# Patient Record
Sex: Male | Born: 1960
Health system: Southern US, Community
[De-identification: ages and names within clinical notes are randomized; demographics above are authoritative.]

## PROBLEM LIST (undated history)

## (undated) DIAGNOSIS — G4489 Other headache syndrome: Secondary | ICD-10-CM

## (undated) DIAGNOSIS — F17200 Nicotine dependence, unspecified, uncomplicated: Secondary | ICD-10-CM

## (undated) DIAGNOSIS — R002 Palpitations: Secondary | ICD-10-CM

## (undated) DIAGNOSIS — D509 Iron deficiency anemia, unspecified: Secondary | ICD-10-CM

## (undated) DIAGNOSIS — I10 Essential (primary) hypertension: Secondary | ICD-10-CM

## (undated) DIAGNOSIS — E785 Hyperlipidemia, unspecified: Secondary | ICD-10-CM

## (undated) DIAGNOSIS — R000 Tachycardia, unspecified: Secondary | ICD-10-CM

## (undated) HISTORY — DX: Nicotine dependence, unspecified, uncomplicated: F17.200

## (undated) HISTORY — DX: Palpitations: R00.2

## (undated) HISTORY — DX: Hyperlipidemia, unspecified: E78.5

## (undated) HISTORY — DX: Other headache syndrome: G44.89

## (undated) HISTORY — DX: Iron deficiency anemia, unspecified: D50.9

## (undated) HISTORY — DX: Tachycardia, unspecified: R00.0

---

## 2008-11-03 ENCOUNTER — Encounter: Admission: RE | Admit: 2008-11-03 | Discharge: 2008-11-03 | Payer: Self-pay | Admitting: Infectious Diseases

## 2011-05-08 ENCOUNTER — Emergency Department (INDEPENDENT_AMBULATORY_CARE_PROVIDER_SITE_OTHER): Payer: Self-pay

## 2011-05-08 ENCOUNTER — Emergency Department (INDEPENDENT_AMBULATORY_CARE_PROVIDER_SITE_OTHER)
Admission: EM | Admit: 2011-05-08 | Discharge: 2011-05-08 | Disposition: A | Discharge status: Home / Self Care | Payer: Self-pay | Source: Home / Self Care | Attending: Family Medicine | Admitting: Family Medicine

## 2011-05-08 DIAGNOSIS — IMO0002 Reserved for concepts with insufficient information to code with codable children: Secondary | ICD-10-CM

## 2011-05-08 DIAGNOSIS — S8392XA Sprain of unspecified site of left knee, initial encounter: Secondary | ICD-10-CM

## 2011-05-08 HISTORY — DX: Essential (primary) hypertension: I10

## 2011-05-08 MED ORDER — TRAMADOL HCL 50 MG PO TABS: 50.0000 mg | ORAL_TABLET | Freq: Four times a day (QID) | ORAL | Status: AC | PRN

## 2011-05-08 MED ORDER — IBUPROFEN 600 MG PO TABS: 600.0000 mg | ORAL_TABLET | Freq: Three times a day (TID) | ORAL | Status: AC | PRN

## 2011-05-08 NOTE — ED Notes (Signed)
Pt states he slipped down some stairs yesterday and landed on lt knee.  C/o pain in lt knee.  Has not taken anything for pain

## 2011-05-12 NOTE — ED Provider Notes (Signed)
History     CSN: [id number]  Arrival date & time 05/08/11  1542   First MD Initiated Contact with Patient 05/08/11 1605      Chief Complaint  Patient presents with  . Knee Pain    (Consider location/radiation/quality/duration/timing/severity/associated sxs/prior treatment) HPI Comments: 51 y/o male here c/o left knee pain after falling down stairs at work landing on his flexed left knee. This happened yesterday. Denies hitting his head or any other body parts. No swelling, abrasions, lacerations or hematomas.  Reports pain with walking. Not taking any pain medication.   Past Medical History  Diagnosis Date  . Hypertension     History reviewed. No pertinent past surgical history.  No family history on file.  History  Substance Use Topics  . Smoking status: Never Smoker   . Smokeless tobacco: Not on file  . Alcohol Use: No      Review of Systems  Constitutional: Negative.   Musculoskeletal: Negative.        Left knee pain as above.  All other systems reviewed and are negative.    Allergies  Review of patient's allergies indicates no known allergies.  Home Medications   Current Outpatient Rx  Name Route Sig Dispense Refill  . IBUPROFEN 600 MG PO TABS Oral Take 1 tablet (600 mg total) by mouth every 8 (eight) hours as needed for pain. 30 tablet 0  . TRAMADOL HCL 50 MG PO TABS Oral Take 1 tablet (50 mg total) by mouth every 6 (six) hours as needed for pain. 15 tablet 0    BP 145/97  Pulse 93  Temp(Src) 99.7 F (37.6 C) (Oral)  Resp 16  SpO2 100%  Physical Exam  Nursing note and vitals reviewed. Constitutional: He is oriented to person, place, and time. He appears well-developed and well-nourished. No distress.  HENT:  Head: Normocephalic and atraumatic.  Right Ear: External ear normal.  Left Ear: External ear normal.  Mouth/Throat: Oropharynx is clear and moist.  Eyes: EOM are normal. Pupils are equal, round, and reactive to light.  Neck: Normal range  of motion.  Cardiovascular: Normal heart sounds.   Pulmonary/Chest: Breath sounds normal.  Abdominal: Soft. There is no tenderness.  Musculoskeletal:       Left knee: Patient is wait bearing. No obvious deformity swelling, erythema, hematoma or skin cuts. Diffuse tenderness to palpation more focalized superior to the patella. Patella appears intact and well aligned, no ballotable. Fair active and passive flexion and extension of the knee with pain reported with active flexion.  No laxity, no able to palpate effusion. Negative meniscal maneuvers.   Neurological: He is alert and oriented to person, place, and time.    ED Course  Procedures (including critical care time)  Labs Reviewed - No data to display No results found.   1. Left knee sprain       MDM  No fracture, suprapatellar swelling on Xrays. Normal active flexion but likely over stretch of patellofemoral tendon. Pt placed in knee immobilizer and crutches. RICE, NSAIDs and rehab exercises prescription provided with ortho or sports medicine follow up reccommended.        Sharin Grave, MD 05/12/11 [phone]

## 2017-01-18 ENCOUNTER — Emergency Department (HOSPITAL_COMMUNITY)
Admission: EM | Admit: 2017-01-18 | Discharge: 2017-01-18 | Disposition: A | Discharge status: Home / Self Care | Payer: Self-pay | Attending: Emergency Medicine | Admitting: Emergency Medicine

## 2017-01-18 ENCOUNTER — Emergency Department (HOSPITAL_COMMUNITY): Payer: Self-pay

## 2017-01-18 ENCOUNTER — Encounter (HOSPITAL_COMMUNITY): Payer: Self-pay | Admitting: Emergency Medicine

## 2017-01-18 DIAGNOSIS — R1030 Lower abdominal pain, unspecified: Secondary | ICD-10-CM | POA: Insufficient documentation

## 2017-01-18 DIAGNOSIS — F17200 Nicotine dependence, unspecified, uncomplicated: Secondary | ICD-10-CM | POA: Insufficient documentation

## 2017-01-18 DIAGNOSIS — M545 Low back pain, unspecified: Secondary | ICD-10-CM

## 2017-01-18 DIAGNOSIS — I10 Essential (primary) hypertension: Secondary | ICD-10-CM | POA: Insufficient documentation

## 2017-01-18 LAB — URINALYSIS, ROUTINE W REFLEX MICROSCOPIC
Bacteria, UA: NONE SEEN
Bilirubin Urine: NEGATIVE
Glucose, UA: NEGATIVE mg/dL
Hgb urine dipstick: NEGATIVE
Ketones, ur: NEGATIVE mg/dL
LEUKOCYTES UA: NEGATIVE
Nitrite: NEGATIVE
PROTEIN: 100 mg/dL — AB
SPECIFIC GRAVITY, URINE: 1.035 — AB (ref 1.005–1.030)
SQUAMOUS EPITHELIAL / LPF: NONE SEEN
pH: 5 (ref 5.0–8.0)

## 2017-01-18 MED ORDER — KETOROLAC TROMETHAMINE 60 MG/2ML IM SOLN
60.0000 mg | Freq: Once | INTRAMUSCULAR | Status: AC
  Administered 2017-01-18: 60 mg via INTRAMUSCULAR
  Filled 2017-01-18: qty 2

## 2017-01-18 MED ORDER — CYCLOBENZAPRINE HCL 10 MG PO TABS: 10.0000 mg | ORAL_TABLET | Freq: Two times a day (BID) | ORAL | 0 refills | Status: DC | PRN

## 2017-01-18 MED ORDER — IBUPROFEN 600 MG PO TABS: 600.0000 mg | ORAL_TABLET | Freq: Four times a day (QID) | ORAL | 0 refills | Status: DC | PRN

## 2017-01-18 NOTE — ED Provider Notes (Signed)
[facility]-EMERGENCY DEPT Provider Note   CSN: [id number] Arrival date & time: 01/18/17  3086     History   Chief Complaint Chief Complaint  Patient presents with  . Back Pain    HPI Jason Kent is a 56 y.o. male who presents with back pain and abdominal pain. No significant PMH. He states he was in his usual state of health until last night at about 2AM when he was acutely woken up with low back pain and lower abdominal pain. He has never has this before. The pain is severe and he cannot get comfortable. It feels better standing and worse with he sits or lies down. Nothing has made it better. He checked his blood pressure and it was in the 200s systolic so he came to the ED. He has done some lifting but nothing heavy and did not have pain after lifting at work on Friday. He denies leg pain or weakness. No hematuria or hx of kidney stones. No fever, syncope, trauma, unexplained weight loss, hx of cancer, loss of bowel/bladder function, saddle anesthesia, urinary retention, IVDU.    HPI  Past Medical History:  Diagnosis Date  . Hypertension     There are no active problems to display for this patient.   History reviewed. No pertinent surgical history.     Home Medications    Prior to Admission medications   Medication Sig Start Date End Date Taking? Authorizing Provider  ibuprofen (ADVIL,MOTRIN) 200 MG tablet Take 400 mg by mouth every 6 (six) hours as needed for headache or moderate pain.   Yes [provider]    Family History No family history on file.  Social History Social History  Substance Use Topics  . Smoking status: Current Every Day Smoker  . Smokeless tobacco: Current User  . Alcohol use Yes     Allergies   Patient has no known allergies.   Review of Systems Review of Systems  Constitutional: Negative for fever.  Gastrointestinal: Positive for abdominal pain. Negative for nausea and vomiting.  Genitourinary: Negative for difficulty  urinating, dysuria, frequency and hematuria.  Musculoskeletal: Positive for back pain and myalgias. Negative for gait problem.  Neurological: Negative for syncope, weakness and numbness.  All other systems reviewed and are negative.    Physical Exam Updated Vital Signs BP (!) 152/109 (BP Location: Left Arm)   Pulse (!) 120   Temp 97.9 F (36.6 C) (Oral)   Resp 17   Ht  (1.753 m)   SpO2 98%   Physical Exam  Constitutional: He is oriented to person, place, and time. He appears well-developed and well-nourished. He appears distressed (pacing around room).  Tremulous  HENT:  Head: Normocephalic and atraumatic.  Eyes: Pupils are equal, round, and reactive to light. Conjunctivae are normal. Right eye exhibits no discharge. Left eye exhibits no discharge. No scleral icterus.  Neck: Normal range of motion.  Cardiovascular: Regular rhythm.  Tachycardia present.  Exam reveals no gallop and no friction rub.   No murmur heard. Pulmonary/Chest: Effort normal and breath sounds normal. No respiratory distress. He has no wheezes. He has no rales. He exhibits no tenderness.  Abdominal: Soft. Bowel sounds are normal. He exhibits no distension and no mass. There is tenderness (lower abdominal tenderess). There is no rebound and no guarding. No hernia.  Musculoskeletal:  Back: Inspection: No masses, deformity, or rash Palpation: Diffuse low back tenderness Strength: 5/5 in lower extremities and normal plantar and dorsiflexion Sensation: Intact sensation with light  touch in lower extremities bilaterally Reflexes: Patellar reflex is 2+ bilaterally Gait: Normal gait   Neurological: He is alert and oriented to person, place, and time.  Skin: Skin is warm and dry.  Psychiatric: He has a normal mood and affect. His behavior is normal.  Nursing note and vitals reviewed.    ED Treatments / Results  Labs (all labs ordered are listed, but only abnormal results are displayed) Labs Reviewed    URINALYSIS, ROUTINE W REFLEX MICROSCOPIC - Abnormal; Notable for the following:       Result Value   Specific Gravity, Urine 1.035 (*)    Protein, ur 100 (*)    All other components within normal limits    EKG  EKG Interpretation None       Radiology Ct Renal Stone Study  Result Date: 01/18/2017 CLINICAL DATA:  Mid back pain for 2 days.  History of hypertension. EXAM: CT ABDOMEN AND PELVIS WITHOUT CONTRAST TECHNIQUE: Multidetector CT imaging of the abdomen and pelvis was performed following the standard protocol without IV contrast. COMPARISON:  None. FINDINGS: Lower chest: Mild cardiomegaly. Hepatobiliary: Diffuse hepatic steatosis.  Gallbladder unremarkable. Pancreas: Unremarkable Spleen: Unremarkable Adrenals/Urinary Tract: Adrenal glands normal. Exophytic 2.1 cm lesion from the left mid kidney posteriorly, density 3 Hounsfield units (fluid density). Less likely a cyst. No urinary tract calculi or hydronephrosis. Stomach/Bowel: Unremarkable Vascular/Lymphatic: Unremarkable Reproductive: Unremarkable Other: No supplemental non-categorized findings. Musculoskeletal: Mild lumbar spondylosis and degenerative disc disease with borderline bilateral foraminal stenosis at the L4-5 level. IMPRESSION: 1. A specific cause for the patient's pain is not identified. 2. Mild cardiomegaly. 3. Diffuse hepatic steatosis. 4. Left renal cyst. 5. Mild lumbar spondylosis and degenerative disc disease. Electronically Signed   By: Gaylyn Rong M.D.   On: 01/18/2017 12:24    Procedures Procedures (including critical care time)  Medications Ordered in ED Medications  ketorolac (TORADOL) injection 60 mg (60 mg Intramuscular Given 01/18/17 1105)     Initial Impression / Assessment and Plan / ED Course  I have reviewed the triage vital signs and the nursing notes.  Pertinent labs & imaging results that were available during my care of the patient were reviewed by me and considered in my medical decision  making (see chart for details).  56 year old male presents with new onset low back pain. Neuro exam is unremarkable. He is initially tachycardic and hypertensive. Tachycardia is improved on recheck however he is still hypertensive. CT renal was done due to pt's distress. Toradol ordered.   CT shows mild cardiomegaly, hepatic steatosis, and lumbar degenerative disc disease. Discussed results with patient. Advised establish care with primary doctor. Rx for Ibuprofen and Flexeril was given. Family verbalized understanding.  Final Clinical Impressions(s) / ED Diagnoses   Final diagnoses:  Acute bilateral low back pain without sciatica    New Prescriptions New Prescriptions   No medications on file     Bethel Born, PA-C 01/18/17 1426    Bethel Born, PA-C 01/18/17 1426    Gwyneth Sprout, MD 01/18/17 2258

## 2017-01-18 NOTE — ED Notes (Signed)
Patient denies changes to bowel of bladder.

## 2017-01-18 NOTE — Discharge Instructions (Signed)
Take Ibuprofen three times daily for the next week. Take this medicine with food. Take muscle relaxer at bedtime to help you sleep. This medicine makes you drowsy so do not take before driving or work Use a heating pad for sore muscles - use for 20 minutes several times a day Return for worsening symptoms Call [facility] to establish care with a primary doctor

## 2017-01-18 NOTE — ED Triage Notes (Signed)
Son/translator stated  Back pain since last night. No injury

## 2018-01-15 ENCOUNTER — Emergency Department (HOSPITAL_COMMUNITY): Payer: Self-pay

## 2018-01-15 ENCOUNTER — Emergency Department (HOSPITAL_COMMUNITY)
Admission: EM | Admit: 2018-01-15 | Discharge: 2018-01-15 | Disposition: A | Discharge status: Home / Self Care | Payer: Self-pay | Attending: Emergency Medicine | Admitting: Emergency Medicine

## 2018-01-15 ENCOUNTER — Other Ambulatory Visit: Payer: Self-pay

## 2018-01-15 ENCOUNTER — Encounter (HOSPITAL_COMMUNITY): Payer: Self-pay | Admitting: *Deleted

## 2018-01-15 DIAGNOSIS — R42 Dizziness and giddiness: Secondary | ICD-10-CM | POA: Insufficient documentation

## 2018-01-15 DIAGNOSIS — Z79899 Other long term (current) drug therapy: Secondary | ICD-10-CM | POA: Insufficient documentation

## 2018-01-15 DIAGNOSIS — I10 Essential (primary) hypertension: Secondary | ICD-10-CM | POA: Insufficient documentation

## 2018-01-15 DIAGNOSIS — F17200 Nicotine dependence, unspecified, uncomplicated: Secondary | ICD-10-CM | POA: Insufficient documentation

## 2018-01-15 LAB — CBC
HEMATOCRIT: 35.4 % — AB (ref 39.0–52.0)
HEMOGLOBIN: 10.5 g/dL — AB (ref 13.0–17.0)
MCH: 24.9 pg — AB (ref 26.0–34.0)
MCHC: 29.7 g/dL — AB (ref 30.0–36.0)
MCV: 83.9 fL (ref 78.0–100.0)
Platelets: 258 10*3/uL (ref 150–400)
RBC: 4.22 MIL/uL (ref 4.22–5.81)
RDW: 17 % — AB (ref 11.5–15.5)
WBC: 6.2 10*3/uL (ref 4.0–10.5)

## 2018-01-15 LAB — URINALYSIS, ROUTINE W REFLEX MICROSCOPIC
BACTERIA UA: NONE SEEN
BILIRUBIN URINE: NEGATIVE
Glucose, UA: NEGATIVE mg/dL
Ketones, ur: NEGATIVE mg/dL
NITRITE: NEGATIVE
PH: 6 (ref 5.0–8.0)
Protein, ur: NEGATIVE mg/dL
SPECIFIC GRAVITY, URINE: 1.004 — AB (ref 1.005–1.030)

## 2018-01-15 LAB — BASIC METABOLIC PANEL
Anion gap: 12 (ref 5–15)
BUN: 7 mg/dL (ref 6–20)
CALCIUM: 9.6 mg/dL (ref 8.9–10.3)
CO2: 22 mmol/L (ref 22–32)
Chloride: 101 mmol/L (ref 98–111)
Creatinine, Ser: 0.79 mg/dL (ref 0.61–1.24)
GFR calc Af Amer: >60 mL/min (ref >60)
GFR calc non Af Amer: >60 mL/min (ref >60)
GLUCOSE: 134 mg/dL — AB (ref 70–99)
Potassium: 3.4 mmol/L — ABNORMAL LOW (ref 3.5–5.1)
Sodium: 135 mmol/L (ref 135–145)

## 2018-01-15 MED ORDER — KETOROLAC TROMETHAMINE 60 MG/2ML IM SOLN
15.0000 mg | Freq: Once | INTRAMUSCULAR | Status: AC
  Administered 2018-01-15: 15 mg via INTRAMUSCULAR
  Filled 2018-01-15: qty 2

## 2018-01-15 MED ORDER — MECLIZINE HCL 25 MG PO TABS: 25.0000 mg | ORAL_TABLET | Freq: Three times a day (TID) | ORAL | 0 refills | Status: DC | PRN

## 2018-01-15 MED ORDER — MECLIZINE HCL 25 MG PO TABS
25.0000 mg | ORAL_TABLET | Freq: Once | ORAL | Status: AC
  Administered 2018-01-15: 25 mg via ORAL
  Filled 2018-01-15: qty 1

## 2018-01-15 MED ORDER — HYDRALAZINE HCL 25 MG PO TABS: 12.5000 mg | ORAL_TABLET | Freq: Two times a day (BID) | ORAL | 0 refills | Status: DC

## 2018-01-15 NOTE — ED Provider Notes (Signed)
Patient placed in Quick Look pathway, seen and evaluated   Chief Complaint: headache, vertigo  HPI: Patient complains of acute onset of headache and a spinning sensation starting yesterday.  Symptoms have been waxing and waning.  It was causing him difficulty driving today.  Currently symptoms are mild.  He is able to walk.  He does not typically get headaches.  Denies head injury, fevers.  ROS:  Positive ROS: (+) Headache Negative ROS: (-) Confusion, fever, vomiting  Physical Exam:   Gen: No distress  Neuro: Awake and Alert  Skin: Warm    Focused Exam: Heart RRR, nml S1,S2, no m/r/g; Lungs CTAB; Abd soft, NT, no rebound or guarding; Ext 2+ pedal pulses bilaterally, no edema; Neuro upper and lower extremity with normal strength, grossly normal cranial nerves, patient ambulates without difficulty.  BP (!) 176/102 (BP Location: Right Arm)   Pulse (!) 106   Temp 99 F (37.2 C) (Oral)   Resp 20   SpO2 100%   Plan: Patient with reported vertigo and headache.  As patient does not typically have headaches and age is greater than 50, will perform head CT to rule out intracranial abnormality.  Low concern for stroke at this time.  Initiation of care has begun. The patient has been counseled on the process, plan, and necessity for staying for the completion/evaluation, and the remainder of the medical screening examination    Renne CriglerGeiple, Taylorann Tkach, Cordelia Poche-C 01/15/18 1430    Alvira MondaySchlossman, Erin, MD 01/16/18 [phone]

## 2018-01-15 NOTE — Discharge Instructions (Signed)
Take hydralazine for high blood pressure.  As we discussed, your blood pressure was high here in the Ed. he did have it rechecked.  Please follow-up with the [facility] to have your blood pressure reevaluated.  Take the meclizine as directed for dizziness.  Return to emergency department for any vision changes, headache, chest pain, difficulty breathing, numbness/weakness of the arms or legs, difficulty walking, vomiting or any other worsening or concerning symptoms.

## 2018-01-15 NOTE — ED Provider Notes (Signed)
[facility] EMERGENCY DEPARTMENT Provider Note   CSN: [id number] Arrival date & time: 01/15/18  1411     History   Chief Complaint Chief Complaint  Patient presents with  . Headache  . Dizziness    HPI Jason Kent is a 57 y.o. male past medical history of hypertension (who is not on any medications) presents for evaluation of 3 days of frontal headache and dizziness.  He states that symptoms are intermittent and brought on by any specific action.  He states that there is no alleviating or aggravating factors of the headache.  He states that when he has the symptoms, the last for about an hour before resolving.  He does report that yesterday while he was at work, he had to stop working and sit on the floor because he states he felt like he was dizzy.  He states that when the dizziness occurs, he has no difficulty walking but instead feels like a spinning sensation.  States that the headache is frontal in nature and states it is gradual when it starts. No history of trauma, fall. Reports that his headache improves when he eats something bitter.  He has not taken any medications for the pain.  He states that when he has this headache and dizziness, he does not have any vision changes, numbness/weakness, speech difficulty.  He has not had any nausea or vomiting.  He reports he is still been able to ambulate without any difficulty.  He states he is not currently on any medications for her blood his blood pressure.  He states he has been told he has high blood pressure before but states that he has never gotten evaluated and been put on medication.  He denies any blurry vision, chest pain, difficulty breathing, abdominal pain, numbness/weakness of his arms or legs.  The history is provided by the patient. The history is limited by a language barrier. A language interpreter was used.    Past Medical History:  Diagnosis Date  . Hypertension     There are no active  problems to display for this patient.   History reviewed. No pertinent surgical history.      Home Medications    Prior to Admission medications   Medication Sig Start Date End Date Taking? Authorizing Provider  cyclobenzaprine (FLEXERIL) 10 MG tablet Take 1 tablet (10 mg total) by mouth 2 (two) times daily as needed for muscle spasms. 01/18/17   Bethel Born, PA-C  hydrALAZINE (APRESOLINE) 25 MG tablet Take 0.5 tablets (12.5 mg total) by mouth 2 (two) times daily. 01/15/18 02/14/18  Maxwell Caul, PA-C  ibuprofen (ADVIL,MOTRIN) 200 MG tablet Take 400 mg by mouth every 6 (six) hours as needed for headache or moderate pain.    [provider]  ibuprofen (ADVIL,MOTRIN) 600 MG tablet Take 1 tablet (600 mg total) by mouth every 6 (six) hours as needed. 01/18/17   Bethel Born, PA-C  meclizine (ANTIVERT) 25 MG tablet Take 1 tablet (25 mg total) by mouth 3 (three) times daily as needed for dizziness. 01/15/18   Maxwell Caul, PA-C    Family History History reviewed. No pertinent family history.  Social History Social History   Tobacco Use  . Smoking status: Current Every Day Smoker  . Smokeless tobacco: Current User  Substance Use Topics  . Alcohol use: Yes  . Drug use: No     Allergies   Patient has no known allergies.   Review of Systems Review  of Systems  Constitutional: Negative for fever.  Eyes: Negative for visual disturbance.  Respiratory: Negative for cough and shortness of breath.   Cardiovascular: Negative for chest pain.  Gastrointestinal: Negative for abdominal pain, nausea and vomiting.  Genitourinary: Negative for dysuria and hematuria.  Neurological: Positive for dizziness and headaches. Negative for weakness and numbness.  All other systems reviewed and are negative.    Physical Exam Updated Vital Signs BP (!) 158/97 (BP Location: Right Arm)   Pulse (!) 58   Temp 98.3 F (36.8 C) (Oral)   Resp 18   SpO2 100%   Physical  Exam  Constitutional: He is oriented to person, place, and time. He appears well-developed and well-nourished.  Well appearing  HENT:  Head: Normocephalic and atraumatic.  Mouth/Throat: Oropharynx is clear and moist and mucous membranes are normal.  No tenderness to palpation of skull. No deformities or crepitus noted. No open wounds, abrasions or lacerations.   Eyes: Pupils are equal, round, and reactive to light. Conjunctivae, EOM and lids are normal.  Neck: Full passive range of motion without pain.  Cardiovascular: Normal rate, regular rhythm, normal heart sounds and normal pulses. Exam reveals no gallop and no friction rub.  No murmur heard. Pulmonary/Chest: Effort normal and breath sounds normal.  Lungs clear to auscultation bilaterally.  Symmetric chest rise.  No wheezing, rales, rhonchi.  Abdominal: Soft. Normal appearance. There is no tenderness. There is no rigidity and no guarding.  Musculoskeletal: Normal range of motion.  Neurological: He is alert and oriented to person, place, and time.  Cranial nerves III-XII intact Follows commands, Moves all extremities  5/5 strength to BUE and BLE  Sensation intact throughout all major nerve distributions Normal coordination No pronator drift. No gait abnormalities  No slurred speech. No facial droop.   Skin: Skin is warm and dry. Capillary refill takes less than 2 seconds.  Psychiatric: He has a normal mood and affect. His speech is normal.  Nursing note and vitals reviewed.    ED Treatments / Results  Labs (all labs ordered are listed, but only abnormal results are displayed) Labs Reviewed  BASIC METABOLIC PANEL - Abnormal; Notable for the following components:      Result Value   Potassium 3.4 (*)    Glucose, Bld 134 (*)    All other components within normal limits  CBC - Abnormal; Notable for the following components:   Hemoglobin 10.5 (*)    HCT 35.4 (*)    MCH 24.9 (*)    MCHC 29.7 (*)    RDW 17.0 (*)    All other  components within normal limits  URINALYSIS, ROUTINE W REFLEX MICROSCOPIC - Abnormal; Notable for the following components:   Color, Urine STRAW (*)    Specific Gravity, Urine 1.004 (*)    Hgb urine dipstick SMALL (*)    Leukocytes, UA TRACE (*)    All other components within normal limits    EKG EKG Interpretation  Date/Time:  Friday January 15 2018 14:18:20 EDT Ventricular Rate:  103 PR Interval:  170 QRS Duration: 96 QT Interval:  342 QTC Calculation: 448 R Axis:   6 Text Interpretation:  Sinus tachycardia Incomplete right bundle branch block Septal infarct , age undetermined Abnormal ECG no prior to compare with Confirmed by Meridee Score [phone]) on 01/15/2018 8:12:09 PM   Radiology Ct Head Wo Contrast  Result Date: 01/15/2018 CLINICAL DATA:  Generalized headache and dizziness EXAM: CT HEAD WITHOUT CONTRAST TECHNIQUE: Contiguous axial images were obtained from  the base of the skull through the vertex without intravenous contrast. COMPARISON:  None. FINDINGS: Brain: No mass effect, midline shift, or acute intracranial hemorrhage. Vascular: No hyperdense vessel or unexpected calcification. Skull: Intact. Sinuses/Orbits: Nasal septum deviated to the right. Mucous retention cysts and mucosal thickening in the right maxillary sinus. Partial opacification of the left posterior ethmoid air cell. Mastoid air cells and remainder of the visualized paranasal sinuses are clear. Other: Noncontributory. IMPRESSION: No acute intracranial pathology. Electronically Signed   By: Jolaine Click M.D.   On: 01/15/2018 15:16    Procedures Procedures (including critical care time)  Medications Ordered in ED Medications  ketorolac (TORADOL) injection 15 mg (15 mg Intramuscular Given 01/15/18 2043)  meclizine (ANTIVERT) tablet 25 mg (25 mg Oral Given 01/15/18 2043)     Initial Impression / Assessment and Plan / ED Course  I have reviewed the triage vital signs and the nursing notes.  Pertinent  labs & imaging results that were available during my care of the patient were reviewed by me and considered in my medical decision making (see chart for details).     57 year old male with no significant past medical history who presents for evaluation of 3 days of intermittent headache and dizziness.  States that symptoms come and go.  There is no action that brings symptoms about and he states that he has not random times.  States dizziness was worse yesterday when he was at work and he had to sit down because everything was spinning.  He states that symptoms resolved eventually after few hours or so.  No numbness/weakness, chest pain, difficulty breathing, vision changes during this.  He states he has been told he has high blood pressure in the past but states he has not gotten evaluated not started on any medication. Patient is afebrile, non-toxic appearing, sitting comfortably on examination table. Vital signs reviewed.  He is slightly hypertensive on exam.  On exam, patient with no neuro deficits.  Normal gait with no signs of ataxia.  Consider tension headache versus vertigo.  Low suspicion for intracranial abnormality such as ICH given history/physical exam.  Additionally, low suspicion for mass occupying lesion.  Additionally, given that symptoms come and go, do not suspect CVA as this would not be atypical presentation of posterior circulation CVA.  History/physical exam is not concerning for cavernous venous thrombosis, meningitis. Do not suspect ACS etiology given history/physical exam. Plan to give meclizine and analgesics for headache and reassess.  Initial labs and imaging ordered at triage.  CBC shows no leukocytosis.  Hemoglobin is 10.5.  BMP shows potassium 3.4 but otherwise unremarkable.  UA shows trace leukocytes but no bacteria seen.  CT head is unremarkable.  EKG shows incomplete right bundle branch and sinus tachycardia.  No priors for comparison.  Patient denies any chest pain or  difficulty breathing.  Reevaluation with the help of language interpreter.  Patient states he does not currently have any headache and denies any symptoms after medications here in ED.  He currently has no dizziness here in the department.  He is able to ambulate without any difficulty.  Patient is still slightly hypertensive.  Review of patient's previous visits in the ED showed he had been hypertensive before.  Additionally, patient told me he has been told his blood pressure was high but he is never been started on medication.  He does not have a primary care doctor to follow-up with.  We will plan to start him on a low-dose of HCTZ  for blood pressure management as this may be contributing to his symptoms.  Additionally, will give him medicine for vertigo.  Patient is stable for discharge at this time. At this time, patient exhibits no emergent life-threatening condition that require further evaluation in ED. instructed patient to follow-up with referred [facility] to establish primary care. Patient had ample opportunity for questions and discussion. All patient's questions were answered with full understanding. Strict return precautions discussed. Patient expresses understanding and agreement to plan.    Final Clinical Impressions(s) / ED Diagnoses   Final diagnoses:  Vertigo  Hypertension, unspecified type    ED Discharge Orders         Ordered    hydrALAZINE (APRESOLINE) 25 MG tablet  2 times daily     01/15/18 2217    meclizine (ANTIVERT) 25 MG tablet  3 times daily PRN     01/15/18 2218           Maxwell CaulLayden, Analucia Hush A, PA-C 01/16/18 0044    Terrilee FilesButler, Michael C, MD 01/16/18 1124

## 2018-01-15 NOTE — ED Triage Notes (Signed)
Pt in c/o generalized headache and dizziness that started yesterday, no distress noted

## 2018-05-10 ENCOUNTER — Encounter: Payer: Self-pay | Admitting: Internal Medicine

## 2018-05-10 ENCOUNTER — Ambulatory Visit: Payer: Self-pay | Attending: Internal Medicine | Admitting: Internal Medicine

## 2018-05-10 VITALS — BP 158/106 | HR 93 | Temp 99.0°F | Resp 16 | Ht 66.0 in | Wt 181.4 lb

## 2018-05-10 DIAGNOSIS — Z23 Encounter for immunization: Secondary | ICD-10-CM | POA: Insufficient documentation

## 2018-05-10 DIAGNOSIS — I10 Essential (primary) hypertension: Secondary | ICD-10-CM | POA: Insufficient documentation

## 2018-05-10 DIAGNOSIS — Z1211 Encounter for screening for malignant neoplasm of colon: Secondary | ICD-10-CM | POA: Insufficient documentation

## 2018-05-10 DIAGNOSIS — Z6829 Body mass index (BMI) 29.0-29.9, adult: Secondary | ICD-10-CM | POA: Insufficient documentation

## 2018-05-10 DIAGNOSIS — F17210 Nicotine dependence, cigarettes, uncomplicated: Secondary | ICD-10-CM | POA: Insufficient documentation

## 2018-05-10 DIAGNOSIS — E663 Overweight: Secondary | ICD-10-CM | POA: Insufficient documentation

## 2018-05-10 DIAGNOSIS — D649 Anemia, unspecified: Secondary | ICD-10-CM | POA: Insufficient documentation

## 2018-05-10 DIAGNOSIS — F17200 Nicotine dependence, unspecified, uncomplicated: Secondary | ICD-10-CM | POA: Insufficient documentation

## 2018-05-10 MED ORDER — LISINOPRIL-HYDROCHLOROTHIAZIDE 10-12.5 MG PO TABS: 1.0000 | ORAL_TABLET | Freq: Every day | ORAL | 3 refills | Status: DC

## 2018-05-10 MED FILL — LISINOPRIL-HCTZ 10-12.5 MG: 10-12.5 | 30 days supply | Qty: 30 | Fill #0

## 2018-05-10 NOTE — Patient Instructions (Addendum)
Please give patient an appointment to see the clinical pharmacist in 2 weeks for repeat blood pressure check.  Influenza Virus Vaccine injection (Fluarix) What is this medicine? INFLUENZA VIRUS VACCINE (in floo EN zuh VAHY ruhs vak SEEN) helps to reduce the risk of getting influenza also known as the flu. This medicine may be used for other purposes; ask your health care provider or pharmacist if you have questions. COMMON BRAND NAME(S): Fluarix, Fluzone What should I tell my health care provider before I take this medicine? They need to know if you have any of these conditions: -bleeding disorder like hemophilia -fever or infection -Guillain-Barre syndrome or other neurological problems -immune system problems -infection with the human immunodeficiency virus (HIV) or AIDS -low blood platelet counts -multiple sclerosis -an unusual or allergic reaction to influenza virus vaccine, eggs, chicken proteins, latex, gentamicin, other medicines, foods, dyes or preservatives -pregnant or trying to get pregnant -breast-feeding How should I use this medicine? This vaccine is for injection into a muscle. It is given by a health care professional. A copy of Vaccine Information Statements will be given before each vaccination. Read this sheet carefully each time. The sheet may change frequently. Talk to your pediatrician regarding the use of this medicine in children. Special care may be needed. Overdosage: If you think you have taken too much of this medicine contact a poison control center or emergency room at once. NOTE: This medicine is only for you. Do not share this medicine with others. What if I miss a dose? This does not apply. What may interact with this medicine? -chemotherapy or radiation therapy -medicines that lower your immune system like etanercept, anakinra, infliximab, and adalimumab -medicines that treat or prevent blood clots like warfarin -phenytoin -steroid medicines like  prednisone or cortisone -theophylline -vaccines This list may not describe all possible interactions. Give your health care provider a list of all the medicines, herbs, non-prescription drugs, or dietary supplements you use. Also tell them if you smoke, drink alcohol, or use illegal drugs. Some items may interact with your medicine. What should I watch for while using this medicine? Report any side effects that do not go away within 3 days to your doctor or health care professional. Call your health care provider if any unusual symptoms occur within 6 weeks of receiving this vaccine. You may still catch the flu, but the illness is not usually as bad. You cannot get the flu from the vaccine. The vaccine will not protect against colds or other illnesses that may cause fever. The vaccine is needed every year. What side effects may I notice from receiving this medicine? Side effects that you should report to your doctor or health care professional as soon as possible: -allergic reactions like skin rash, itching or hives, swelling of the face, lips, or tongue Side effects that usually do not require medical attention (report to your doctor or health care professional if they continue or are bothersome): -fever -headache -muscle aches and pains -pain, tenderness, redness, or swelling at site where injected -weak or tired This list may not describe all possible side effects. Call your doctor for medical advice about side effects. You may report side effects to FDA at 1-800-FDA-1088. Where should I keep my medicine? This vaccine is only given in a clinic, pharmacy, doctor's office, or other health care setting and will not be stored at home. NOTE: This sheet is a summary. It may not cover all possible information. If you have questions about this medicine, talk  to your doctor, pharmacist, or health care provider.  2019 Elsevier/Gold Standard (2007-11-17 09:30:40)

## 2018-05-10 NOTE — Progress Notes (Signed)
Patient ID: Jason Kent, male    DOB: 04/10/1961  MRN: [id number]  CC: New Patient (Initial Visit)   Subjective: Jason Kent is a 58 y.o. male who presents for new pt visit.  Son, Jason Kent, is with him and interprets.  [place] from [place] His concerns today include:   Pt with hx of HTN x 9 mths. Was on Hydralazine but out x 2-3 mths He endorses HA and dizziness since being out of med He chews tobacco and smokes 2 cigarettes a day.  Drinks two 24 oz beer most days  Noted to have mild anemia on blood test done 01/2018.  He endorses occasional dizziness.  Denies noticing any blood in the stools or the urine.  Family history, social history and past surgical histories reviewed.  Family history is noncontributory.  He has not had any surgeries in the past.  Current Outpatient Medications on File Prior to Visit  Medication Sig Dispense Refill  . hydrALAZINE (APRESOLINE) 25 MG tablet Take 0.5 tablets (12.5 mg total) by mouth 2 (two) times daily. 30 tablet 0  . ibuprofen (ADVIL,MOTRIN) 200 MG tablet Take 400 mg by mouth every 6 (six) hours as needed for headache or moderate pain.     No current facility-administered medications on file prior to visit.     No Known Allergies  Social History   Socioeconomic History  . Marital status: Married    Spouse name: Not on file  . Number of children: Not on file  . Years of education: Not on file  . Highest education level: Not on file  Occupational History  . Not on file  Social Needs  . Financial resource strain: Not on file  . Food insecurity:    Worry: Not on file    Inability: Not on file  . Transportation needs:    Medical: Not on file    Non-medical: Not on file  Tobacco Use  . Smoking status: Current Every Day Smoker    Packs/day: 0.25    Types: Cigarettes  . Smokeless tobacco: Never Used  Substance and Sexual Activity  . Alcohol use: Yes    Alcohol/week: 2.0 standard drinks    Types: 2 Cans of beer per week  . Drug  use: No  . Sexual activity: Not on file  Lifestyle  . Physical activity:    Days per week: Not on file    Minutes per session: Not on file  . Stress: Not on file  Relationships  . Social connections:    Talks on phone: Not on file    Gets together: Not on file    Attends religious service: Not on file    Active member of club or organization: Not on file    Attends meetings of clubs or organizations: Not on file    Relationship status: Not on file  . Intimate partner violence:    Fear of current or ex partner: Not on file    Emotionally abused: Not on file    Physically abused: Not on file    Forced sexual activity: Not on file  Other Topics Concern  . Not on file  Social History Narrative  . Not on file    No family history on file.  No past surgical history on file.  ROS: Review of Systems  Constitutional: Negative for activity change and unexpected weight change.  Cardiovascular: Negative for chest pain.  Gastrointestinal: Negative for blood in stool.  Genitourinary: Negative for difficulty urinating  and hematuria.  Psychiatric/Behavioral: Negative for dysphoric mood and sleep disturbance.    PHYSICAL EXAM: BP (!) 158/106 (BP Location: Left Arm, Cuff Size: Normal) Comment: recheck  Pulse 93   Temp 99 F (37.2 C) (Oral)   Resp 16   Ht 5\' 6"  (1.676 m)   Wt 181 lb 6.4 oz (82.3 kg)   SpO2 100%   BMI 29.28 kg/m   Physical Exam  General appearance - alert, well appearing, and in no distress Mental status - normal mood, behavior, speech, dress, motor activity, and thought processes Eyes: Slightly pale conjunctiva Nose - normal and patent, no erythema, discharge or polyps Mouth - mucous membranes moist, pharynx normal without lesions.  Teeth are stained black Neck - supple, no significant adenopathy Chest - clear to auscultation, no wheezes, rales or rhonchi, symmetric air entry Heart - normal rate, regular rhythm, normal S1, S2, no murmurs, rubs, clicks or  gallops Extremities - peripheral pulses normal, no pedal edema, no clubbing or cyanosis  Depression screen [facility] 2/9 05/10/2018  Decreased Interest 0  Down, Depressed, Hopeless 0  PHQ - 2 Score 0   Lab Results  Component Value Date   WBC 6.2 01/15/2018   HGB 10.5 (L) 01/15/2018   HCT 35.4 (L) 01/15/2018   MCV 83.9 01/15/2018   PLT 258 01/15/2018     Chemistry      Component Value Date/Time   NA 135 01/15/2018 1429   K 3.4 (L) 01/15/2018 1429   CL 101 01/15/2018 1429   CO2 22 01/15/2018 1429   BUN 7 01/15/2018 1429   CREATININE 0.79 01/15/2018 1429      Component Value Date/Time   CALCIUM 9.6 01/15/2018 1429      ASSESSMENT AND PLAN:  1. Essential hypertension Not at goal of 130/80 or lower.  He has hypertension stage II.  We will start him on lisinopril/HCTZ.  DASH diet discussed and encouraged - CBC - Comprehensive metabolic panel - Lipid panel - lisinopril-hydrochlorothiazide (PRINZIDE,ZESTORETIC) 10-12.5 MG tablet; Take 1 tablet by mouth daily.  Dispense: 90 tablet; Refill: 3  2. Normochromic anemia Recheck CBC with iron studies today - Iron, TIBC and Ferritin Panel  3. Over weight - Hemoglobin A1c  4. Tobacco dependence Advised to quit.  Discussed health risks associated with smoking.  Patient not ready to give a trial of quitting. Less than 10 minutes spent on counseling  5. Need for influenza vaccination Given  6. Colon cancer screening - Fecal occult blood, imunochemical([facility])  Encourage patient to cut back to no more than one 24 ounce beer a day.  To goes beyond the recommended daily allowance.  Patient was given the opportunity to ask questions.  Patient verbalized understanding of the plan and was able to repeat key elements of the plan.   Orders Placed This Encounter  Procedures  . Fecal occult blood, imunochemical([facility])  . Flu Vaccine QUAD 36+ mos IM  . CBC  . Comprehensive metabolic panel  . Lipid panel  . Iron, TIBC  and Ferritin Panel  . Hemoglobin A1c     Requested Prescriptions   Signed Prescriptions Disp Refills  . lisinopril-hydrochlorothiazide (PRINZIDE,ZESTORETIC) 10-12.5 MG tablet 90 tablet 3    Sig: Take 1 tablet by mouth daily.    Return in about 6 weeks (around 06/21/2018).  Jonah Blue, MD, FACP

## 2018-05-11 ENCOUNTER — Other Ambulatory Visit: Payer: Self-pay | Admitting: Internal Medicine

## 2018-05-11 LAB — LIPID PANEL
CHOLESTEROL TOTAL: 279 mg/dL — AB (ref 100–199)
Chol/HDL Ratio: 4.7 ratio (ref 0.0–5.0)
HDL: 59 mg/dL (ref >39)
LDL CALC: 176 mg/dL — AB (ref 0–99)
Triglycerides: 219 mg/dL — ABNORMAL HIGH (ref 0–149)
VLDL CHOLESTEROL CAL: 44 mg/dL — AB (ref 5–40)

## 2018-05-11 LAB — COMPREHENSIVE METABOLIC PANEL WITH GFR
ALT: 20 [IU]/L (ref 0–44)
AST: 23 [IU]/L (ref 0–40)
Albumin/Globulin Ratio: 1.6 (ref 1.2–2.2)
Albumin: 5 g/dL (ref 3.5–5.5)
Alkaline Phosphatase: 73 [IU]/L (ref 39–117)
BUN/Creatinine Ratio: 21 — ABNORMAL HIGH (ref 9–20)
BUN: 19 mg/dL (ref 6–24)
Bilirubin Total: 0.4 mg/dL (ref 0.0–1.2)
CO2: 19 mmol/L — ABNORMAL LOW (ref 20–29)
Calcium: 9.5 mg/dL (ref 8.7–10.2)
Chloride: 96 mmol/L (ref 96–106)
Creatinine, Ser: 0.91 mg/dL (ref 0.76–1.27)
GFR calc Af Amer: 109 mL/min/{1.73_m2}
GFR calc non Af Amer: 94 mL/min/{1.73_m2}
Globulin, Total: 3.2 g/dL (ref 1.5–4.5)
Glucose: 99 mg/dL (ref 65–99)
Potassium: 4.7 mmol/L (ref 3.5–5.2)
Sodium: 136 mmol/L (ref 134–144)
Total Protein: 8.2 g/dL (ref 6.0–8.5)

## 2018-05-11 LAB — IRON,TIBC AND FERRITIN PANEL
Ferritin: 13 ng/mL — ABNORMAL LOW (ref 30–400)
Iron Saturation: 6 % — CL (ref 15–55)
Iron: 31 ug/dL — ABNORMAL LOW (ref 38–169)
Total Iron Binding Capacity: 510 ug/dL — ABNORMAL HIGH (ref 250–450)
UIBC: 479 ug/dL — ABNORMAL HIGH (ref 111–343)

## 2018-05-11 LAB — CBC
HEMOGLOBIN: 11.7 g/dL — AB (ref 13.0–17.7)
Hematocrit: 37.1 % — ABNORMAL LOW (ref 37.5–51.0)
MCH: 26.1 pg — ABNORMAL LOW (ref 26.6–33.0)
MCHC: 31.5 g/dL (ref 31.5–35.7)
MCV: 83 fL (ref 79–97)
PLATELETS: 374 10*3/uL (ref 150–450)
RBC: 4.48 x10E6/uL (ref 4.14–5.80)
RDW: 14.4 % (ref 11.6–15.4)
WBC: 7.3 10*3/uL (ref 3.4–10.8)

## 2018-05-11 LAB — HEMOGLOBIN A1C
Est. average glucose Bld gHb Est-mCnc: 108 mg/dL
Hgb A1c MFr Bld: 5.4 % (ref 4.8–5.6)

## 2018-05-11 MED ORDER — FERROUS SULFATE 325 (65 FE) MG PO TABS: 325.0000 mg | ORAL_TABLET | Freq: Every day | ORAL | 3 refills | Status: DC

## 2018-05-11 MED ORDER — ATORVASTATIN CALCIUM 10 MG PO TABS: 10.0000 mg | ORAL_TABLET | Freq: Every day | ORAL | 3 refills | Status: DC

## 2018-05-22 LAB — SPECIMEN STATUS REPORT

## 2018-05-22 LAB — FECAL OCCULT BLOOD, IMMUNOCHEMICAL: Fecal Occult Bld: NEGATIVE

## 2018-05-24 ENCOUNTER — Encounter: Payer: Self-pay | Admitting: Internal Medicine

## 2018-05-24 ENCOUNTER — Ambulatory Visit: Payer: Self-pay | Attending: Family Medicine | Admitting: Pharmacist

## 2018-05-24 ENCOUNTER — Encounter: Payer: Self-pay | Admitting: Pharmacist

## 2018-05-24 DIAGNOSIS — Z79899 Other long term (current) drug therapy: Secondary | ICD-10-CM | POA: Insufficient documentation

## 2018-05-24 DIAGNOSIS — I10 Essential (primary) hypertension: Secondary | ICD-10-CM | POA: Insufficient documentation

## 2018-05-24 DIAGNOSIS — F17210 Nicotine dependence, cigarettes, uncomplicated: Secondary | ICD-10-CM | POA: Insufficient documentation

## 2018-05-24 MED ORDER — LISINOPRIL-HYDROCHLOROTHIAZIDE 20-25 MG PO TABS: 1.0000 | ORAL_TABLET | Freq: Every day | ORAL | 2 refills | Status: DC

## 2018-05-24 MED ORDER — ATORVASTATIN CALCIUM 40 MG PO TABS: 40.0000 mg | ORAL_TABLET | Freq: Every day | ORAL | 2 refills | Status: DC

## 2018-05-24 MED FILL — ATORVASTATIN CALCIUM 40 MG: 40 | 30 days supply | Qty: 30 | Fill #0

## 2018-05-24 MED FILL — LISINOPRIL-HCTZ 20-25 MG TA: 20-25 | 30 days supply | Qty: 30 | Fill #0

## 2018-05-24 NOTE — Progress Notes (Signed)
   S:    PCP: Dr. Laural Benes  Patient arrives in good spirits. Presents to the clinic for hypertension management. Patient was referred by Dr. Laural Benes on 05/10/2018. BP elevated at that visit - 158/106. Dr. Laural Benes started lisinopril/HCTZ 10-12.5 mg daily.   Patient reports adherence with medications.  Current BP Medications include:  lisinopril-HCTZ 10-12.5 mg daily  Antihypertensives tried in the past include: hydralazine  Dietary habits include: not limiting salt, denies drinking caffeine beverages  Exercise habits include: none outside of work Family / Social history: FHx - nothing listed in [facility], 2 cans of beers/week, current every day smoker (1 cigarette in the morning and 1 in the evening)  Home BP readings:  - Reports SBPs - 180s-190s  O:  L arm after 5 minutes rest: 182/80, HR 83 Last 3 Office BP readings: BP Readings from Last 3 Encounters:  05/10/18 (!) 158/106  01/15/18 (!) 158/97  01/18/17 (!) 151/91    BMET    Component Value Date/Time   NA 136 05/10/2018 1631   K 4.7 05/10/2018 1631   CL 96 05/10/2018 1631   CO2 19 (L) 05/10/2018 1631   GLUCOSE 99 05/10/2018 1631   GLUCOSE 134 (H) 01/15/2018 1429   BUN 19 05/10/2018 1631   CREATININE 0.91 05/10/2018 1631   CALCIUM 9.5 05/10/2018 1631   GFRNONAA 94 05/10/2018 1631   GFRAA 109 05/10/2018 1631   Renal function: Estimated Creatinine Clearance: 91.3 mL/min (by C-G formula based on SCr of 0.91 mg/dL).  Clinical ASCVD: No  The 10-year ASCVD risk score Denman George DC Jr., et al., 2013) is: 22.3%   Values used to calculate the score:     Age: 58 years     Sex: Male     Is Non-Hispanic African American: No     Diabetic: No     Tobacco smoker: Yes     Systolic Blood Pressure: 158 mmHg     Is BP treated: Yes     HDL Cholesterol: 59 mg/dL     Total Cholesterol: 279 mg/dL  A/P: Hypertension: Longstanding currently uncontrolled on current medications. BP Goal <130/80 mmHg. Patient is adherent with current  medications. His BP today in clinic is elevated. Will increase Prinzide as pt reports elevated BP at home as well.  -Increased dose of lisinopril-HCTZ to 20-25 mg daily.  -Recommend BMP in 4-6 weeks  -Counseled on lifestyle modifications for blood pressure control including reduced dietary sodium, increased exercise, adequate sleep  ASCVD:   Pt with high ASCVD risk (risk score > 20%). Last LDL not controlled. I recommend to intensify statin medication.  - Increase dose of atorvastatin 40 mg daily - last LFTs (05/10/17) WNL  Results reviewed and written information provided. Total time in face-to-face counseling 15 minutes.   F/U Clinic Visit in 06/18/18.    Patient seen with:  Arletha Pili, PharmD Candidate  [facility]  Class of 2022  Butch Penny, PharmD, CPP Clinical Pharmacist [facility] [phone]

## 2018-05-24 NOTE — Patient Instructions (Addendum)
Thank you for coming to see [place] today.   Blood pressure today is very elevated.  I have increased the dose of your blood pressure medicine. Please take 1 tablet daily in the morning.   I have sent in a prescription for atorvastatin 40 mg. Take 1 tablet daily in the evening.   Limiting salt and caffeine, as well as exercising as able for at least 30 minutes for 5 days out of the week, can also help you lower your blood pressure.  Take your blood pressure at home if you are able. Please write down these numbers and bring them to your visits.  If you have any questions about medications, please call me [phone].  Luke   Nepali translation:   ?ja h?m?l?'? bh??na ?'unubha'?k?m? Giordano'yav?da.  Raktac?pa ?ja dh?rai ucca cha.   Mail? tap?'?k? raktac?pa au?adhik? m?tr? ba?h?'?k? chu. Kr?pay? bih?na dainika 1 ?y?bl??a linuh?s.   Mail? ???rabh?s???ina mg0 mil?gr?mak? l?gi siph?risam? pa?h?'?k? chu. S?m?jham? 1 ?y?bl??a linuh?s. Nuna ra ky?phina s?mita garna, s?tham? hapt?k? days dinak? l?gi kamtim? 300 min??ak? l?gi vy?y?mal? pani tap?'?nl?'? raktac?pa kama garna maddata gardacha.   Yadi tap?'?? sak?ama hunuhuncha bhan? gharam? ?phn? raktac?pa linuh?s. Kr?pay? y? nambarahar? l?khnuh?s ra tap?'??k? bhrama?ahar?m? tin?har?l?'? ly?'unuh?s.

## 2018-06-18 ENCOUNTER — Encounter: Payer: Self-pay | Admitting: Internal Medicine

## 2018-06-18 ENCOUNTER — Ambulatory Visit: Payer: Self-pay | Attending: Internal Medicine | Admitting: Internal Medicine

## 2018-06-18 VITALS — BP 160/94 | HR 57 | Temp 99.2°F | Resp 16 | Ht 66.0 in | Wt 179.6 lb

## 2018-06-18 DIAGNOSIS — Z7901 Long term (current) use of anticoagulants: Secondary | ICD-10-CM | POA: Insufficient documentation

## 2018-06-18 DIAGNOSIS — F17210 Nicotine dependence, cigarettes, uncomplicated: Secondary | ICD-10-CM | POA: Insufficient documentation

## 2018-06-18 DIAGNOSIS — E782 Mixed hyperlipidemia: Secondary | ICD-10-CM | POA: Insufficient documentation

## 2018-06-18 DIAGNOSIS — I10 Essential (primary) hypertension: Secondary | ICD-10-CM | POA: Insufficient documentation

## 2018-06-18 DIAGNOSIS — K921 Melena: Secondary | ICD-10-CM | POA: Insufficient documentation

## 2018-06-18 DIAGNOSIS — Z79899 Other long term (current) drug therapy: Secondary | ICD-10-CM | POA: Insufficient documentation

## 2018-06-18 DIAGNOSIS — D509 Iron deficiency anemia, unspecified: Secondary | ICD-10-CM | POA: Insufficient documentation

## 2018-06-18 MED ORDER — FERROUS SULFATE 325 (65 FE) MG PO TABS: 325.0000 mg | ORAL_TABLET | Freq: Every day | ORAL | 3 refills | Status: DC

## 2018-06-18 MED FILL — FERROUS SULFATE 325 MG TAB: 325 (65 FE) | 30 days supply | Qty: 30 | Fill #0

## 2018-06-18 NOTE — Progress Notes (Signed)
Patient ID: Jason Kent, male    DOB: 02/07/1961  MRN: [id number]  CC: Hypertension   Subjective: Kathrin RuddyDhan Warnell is a 58 y.o. male who presents for 6 wks f/u His concerns today include:  Patient with history of HTN, normocytic anemia, tobacco dependence, HL, IDA  IDA:  Pt's iron studies shown below.  He was started on iron supplement but has not picked up from pharmacy as yet.  Positive blood in stools intermittently for 2-3 days a a time for past 5-6 yr.  Reports no dizziness.  -no fhx of colon CA -FIT was negative  HTN:  Saw clinical pharmacist since last visit and Lisinopril/HCTZ dose increase to 20/25 mg daily.  Taking med but did not take as yet for today. Trying to limit salt Does a lot of walking at work  HL:  Taking and tolerating Lipitor.   Patient Active Problem List   Diagnosis Date Noted  . Essential hypertension 05/10/2018  . Normochromic anemia 05/10/2018  . Tobacco dependence 05/10/2018     Current Outpatient Medications on File Prior to Visit  Medication Sig Dispense Refill  . atorvastatin (LIPITOR) 40 MG tablet Take 1 tablet (40 mg total) by mouth daily. 30 tablet 2  . ibuprofen (ADVIL,MOTRIN) 200 MG tablet Take 400 mg by mouth every 6 (six) hours as needed for headache or moderate pain.    Marland Kitchen. lisinopril-hydrochlorothiazide (PRINZIDE,ZESTORETIC) 20-25 MG tablet Take 1 tablet by mouth daily. 30 tablet 2   No current facility-administered medications on file prior to visit.     No Known Allergies  Social History   Socioeconomic History  . Marital status: Married    Spouse name: Not on file  . Number of children: Not on file  . Years of education: Not on file  . Highest education level: Not on file  Occupational History  . Not on file  Social Needs  . Financial resource strain: Not on file  . Food insecurity:    Worry: Not on file    Inability: Not on file  . Transportation needs:    Medical: Not on file    Non-medical: Not on file  Tobacco  Use  . Smoking status: Current Every Day Smoker    Packs/day: 0.25    Types: Cigarettes  . Smokeless tobacco: Never Used  Substance and Sexual Activity  . Alcohol use: Yes    Alcohol/week: 2.0 standard drinks    Types: 2 Cans of beer per week  . Drug use: No  . Sexual activity: Not on file  Lifestyle  . Physical activity:    Days per week: Not on file    Minutes per session: Not on file  . Stress: Not on file  Relationships  . Social connections:    Talks on phone: Not on file    Gets together: Not on file    Attends religious service: Not on file    Active member of club or organization: Not on file    Attends meetings of clubs or organizations: Not on file    Relationship status: Not on file  . Intimate partner violence:    Fear of current or ex partner: Not on file    Emotionally abused: Not on file    Physically abused: Not on file    Forced sexual activity: Not on file  Other Topics Concern  . Not on file  Social History Narrative  . Not on file    No family history on file.  No past surgical history on file.  ROS: Review of Systems Negative except as stated above  PHYSICAL EXAM: BP (!) 160/94   Pulse (!) 57   Temp 99.2 F (37.3 C) (Oral)   Resp 16   Ht 5\' 6"  (1.676 m)   Wt 179 lb 9.6 oz (81.5 kg)   SpO2 98%   BMI 28.99 kg/m    Wt Readings from Last 3 Encounters:  06/18/18 179 lb 9.6 oz (81.5 kg)  05/10/18 181 lb 6.4 oz (82.3 kg)    Physical Exam  General appearance - alert, well appearing, and in no distress Mental status - normal mood, behavior, speech, dress, motor activity, and thought processes Neck - supple, no significant adenopathy Chest - clear to auscultation, no wheezes, rales or rhonchi, symmetric air entry Heart - normal rate, regular rhythm, normal S1, S2, no murmurs, rubs, clicks or gallops Extremities - peripheral pulses normal, no pedal edema, no clubbing or cyanosis  CMP Latest Ref Rng & Units 05/10/2018 01/15/2018  Glucose 65 -  99 mg/dL 99 741(O)  BUN 6 - 24 mg/dL 19 7  Creatinine 8.78 - 1.27 mg/dL 6.76 7.20  Sodium 947 - 144 mmol/L 136 135  Potassium 3.5 - 5.2 mmol/L 4.7 3.4(L)  Chloride 96 - 106 mmol/L 96 101  CO2 20 - 29 mmol/L 19(L) 22  Calcium 8.7 - 10.2 mg/dL 9.5 9.6  Total Protein 6.0 - 8.5 g/dL 8.2 -  Total Bilirubin 0.0 - 1.2 mg/dL 0.4 -  Alkaline Phos 39 - 117 IU/L 73 -  AST 0 - 40 IU/L 23 -  ALT 0 - 44 IU/L 20 -   Lipid Panel     Component Value Date/Time   CHOL 279 (H) 05/10/2018 1631   TRIG 219 (H) 05/10/2018 1631   HDL 59 05/10/2018 1631   CHOLHDL 4.7 05/10/2018 1631   LDLCALC 176 (H) 05/10/2018 1631    CBC    Component Value Date/Time   WBC 7.3 05/10/2018 1631   WBC 6.2 01/15/2018 1429   RBC 4.48 05/10/2018 1631   RBC 4.22 01/15/2018 1429   HGB 11.7 (L) 05/10/2018 1631   HCT 37.1 (L) 05/10/2018 1631   PLT 374 05/10/2018 1631   MCV 83 05/10/2018 1631   MCH 26.1 (L) 05/10/2018 1631   MCH 24.9 (L) 01/15/2018 1429   MCHC 31.5 05/10/2018 1631   MCHC 29.7 (L) 01/15/2018 1429   RDW 14.4 05/10/2018 1631   Lab Results  Component Value Date   IRON 31 (L) 05/10/2018   TIBC 510 (H) 05/10/2018   FERRITIN 13 (L) 05/10/2018    ASSESSMENT AND PLAN: 1. Essential hypertension Not at goal but has not taken med as yet today.  Cont med and low salt diet  2. Mixed hyperlipidemia Continue Lipitor  3. Iron deficiency anemia, unspecified iron deficiency anemia type Pt to pick up iron supplement from pharmacy Advise that he needs c-scope and EGD to eval this anemia.  Recommend that he apply for [facility] discount card.  Once approved, will refer to GI.  - ferrous sulfate (FERROUSUL) 325 (65 FE) MG tablet; Take 1 tablet (325 mg total) by mouth daily with breakfast.  Dispense: 60 tablet; Refill: 3    Patient was given the opportunity to ask questions.  Patient verbalized understanding of the plan and was able to repeat key elements of the plan.  [employer] interpreter used during this encounter.  #[id number]  No orders of the defined types were placed in this encounter.  Requested Prescriptions   Signed Prescriptions Disp Refills  . ferrous sulfate (FERROUSUL) 325 (65 FE) MG tablet 60 tablet 3    Sig: Take 1 tablet (325 mg total) by mouth daily with breakfast.    No follow-ups on file.  Jonah Blue, MD, FACP

## 2018-06-21 MED FILL — LISINOPRIL-HCTZ 20-25 MG TA: 20-25 | 30 days supply | Qty: 30 | Fill #1

## 2018-06-21 MED FILL — ATORVASTATIN CALCIUM 40 MG: 40 | 30 days supply | Qty: 30 | Fill #1

## 2018-07-16 ENCOUNTER — Ambulatory Visit (HOSPITAL_COMMUNITY)
Admission: EM | Admit: 2018-07-16 | Discharge: 2018-07-16 | Disposition: A | Discharge status: Home / Self Care | Payer: Self-pay | Attending: Family Medicine | Admitting: Family Medicine

## 2018-07-16 ENCOUNTER — Ambulatory Visit (INDEPENDENT_AMBULATORY_CARE_PROVIDER_SITE_OTHER): Payer: Self-pay

## 2018-07-16 ENCOUNTER — Other Ambulatory Visit: Payer: Self-pay

## 2018-07-16 ENCOUNTER — Encounter (HOSPITAL_COMMUNITY): Payer: Self-pay | Admitting: Emergency Medicine

## 2018-07-16 DIAGNOSIS — R059 Cough, unspecified: Secondary | ICD-10-CM

## 2018-07-16 DIAGNOSIS — R05 Cough: Secondary | ICD-10-CM

## 2018-07-16 MED ORDER — BENZONATATE 100 MG PO CAPS: 100.0000 mg | ORAL_CAPSULE | Freq: Three times a day (TID) | ORAL | 0 refills | Status: DC

## 2018-07-16 NOTE — ED Triage Notes (Signed)
PT C/O: cold sx onset 1 week associated w/prod cough  DENIES: fevers, SOB, dyspnea  TAKING MEDS: none  A&O x4... NAD... Ambulatory

## 2018-07-16 NOTE — ED Provider Notes (Signed)
[facility]    CSN: [id number] Arrival date & time: 07/16/18  1031     History   Chief Complaint Chief Complaint  Patient presents with  . URI    HPI Jason Kent is a 58 y.o. male who presents to the UC with a cough. The symptoms started one week ago. Patient denies fever, chills, any travel out of [place] and no known exposure to anyone with Corona virus. 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  The history is provided by the patient. A language interpreter was used.  URI  Presenting symptoms: cough   Severity:  Moderate Onset quality:  Gradual Duration:  1 week Timing:  Intermittent Progression:  Waxing and waning Chronicity:  New Associated symptoms: no wheezing     Past Medical History:  Diagnosis Date  . Hypertension     Patient Active Problem List   Diagnosis Date Noted  . Iron deficiency anemia 06/18/2018  . Mixed hyperlipidemia 06/18/2018  . Essential hypertension 05/10/2018  . Tobacco dependence 05/10/2018    History reviewed. No pertinent surgical history.     Home Medications    Prior to Admission medications   Medication Sig Start Date End Date Taking? Authorizing Provider  atorvastatin (LIPITOR) 40 MG tablet Take 1 tablet (40 mg total) by mouth daily. 05/24/18  Yes Marcine Matar, MD  ferrous sulfate (FERROUSUL) 325 (65 FE) MG tablet Take 1 tablet (325 mg total) by mouth daily with breakfast. 06/18/18  Yes Marcine Matar, MD  ibuprofen (ADVIL,MOTRIN) 200 MG tablet Take 400 mg by mouth every 6 (six) hours as needed for headache or moderate pain.   Yes [provider]  lisinopril-hydrochlorothiazide (PRINZIDE,ZESTORETIC) 20-25 MG tablet Take 1 tablet by mouth daily. 05/24/18  Yes Marcine Matar, MD  benzonatate (TESSALON) 100 MG capsule Take 1 capsule (100 mg total) by mouth every 8 (eight) hours. 07/16/18   Janne Napoleon, NP    Family History History reviewed. No pertinent family history.  Social History Social History   Tobacco Use  . Smoking status: Current Every Day Smoker    Packs/day: 0.25    Types: Cigarettes  . Smokeless tobacco: Never Used  Substance Use Topics  . Alcohol use: Yes    Alcohol/week: 2.0 standard drinks    Types: 2 Cans of beer per week  . Drug use: No     Allergies   Patient has no known allergies.   Review of Systems Review of Systems  Respiratory: Positive for cough. Negative for chest tightness, shortness of breath and wheezing.   All other systems reviewed and are negative.    Physical Exam Triage Vital Signs ED Triage Vitals  Enc Vitals Group     BP 07/16/18 0957 (!) 153/84     Pulse Rate 07/16/18 0957 (!) 109     Resp 07/16/18 0957 16     Temp 07/16/18 0957 99 F (37.2 C)     Temp Source 07/16/18 0957 Temporal     SpO2 07/16/18 0957 100 %     Weight --      Height --      Head Circumference --      Peak Flow --      Pain Score 07/16/18 0959 4     Pain Loc --      Pain Edu? --      Excl.  in GC? --    No data found.  Updated Vital Signs BP 127/85 (BP Location: Left Arm)   Pulse (!) 107   Temp 99 F (37.2 C) (Temporal)   Resp 18   SpO2 98%   Physical Exam Vitals signs and nursing note reviewed.  Constitutional:      General: He is not in acute distress.    Appearance: He is well-developed. He is not ill-appearing.  HENT:     Head: Normocephalic.     Right Ear: Tympanic membrane normal.     Left Ear: Tympanic membrane normal.     Nose: Nose normal. No congestion.     Mouth/Throat:     Mouth: Mucous membranes are moist.  Eyes:     Extraocular Movements: Extraocular movements intact.     Conjunctiva/sclera: Conjunctivae normal.  Neck:     Musculoskeletal: Neck supple. No neck rigidity.  Cardiovascular:     Rate and Rhythm: Normal rate and regular rhythm.  Pulmonary:     Effort: Pulmonary effort is normal. No respiratory  distress.     Breath sounds: No wheezing or rales.  Chest:     Chest wall: No tenderness.  Abdominal:     Palpations: Abdomen is soft.     Tenderness: There is no abdominal tenderness.  Musculoskeletal: Normal range of motion.  Skin:    General: Skin is warm and dry.  Neurological:     Mental Status: He is alert and oriented to person, place, and time.  Psychiatric:        Mood and Affect: Mood normal.      UC Treatments / Results  Labs (all labs ordered are listed, but only abnormal results are displayed) Labs Reviewed - No data to display  Radiology Dg Chest 2 View  Result Date: 07/16/2018 CLINICAL DATA:  Productive cough, low grade fever for over a week. No known past history EXAM: CHEST - 2 VIEW COMPARISON:  11/03/2008 FINDINGS: Heart size is normal. The lungs are free of focal consolidations and pleural effusions. Degenerative changes are seen in thoracic spine. There is anterior wedge deformity of L1, likely representing a chronic compression injury. IMPRESSION: No evidence for acute cardiopulmonary abnormality. Electronically Signed   By: Norva Pavlov M.D.   On: 07/16/2018 10:50    Procedures Procedures (including critical care time)  Medications Ordered in UC Medications - No data to display  Initial Impression / Assessment and Plan / UC Course  I have reviewed the triage vital signs and the nursing notes. Pt CXR negative for acute infiltrate. Patients symptoms are consistent with URI, likely viral etiology. Discussed that antibiotics are not indicated for viral infections. Pt will be discharged with symptomatic treatment.  Verbalizes understanding and is agreeable with plan. Pt is hemodynamically stable & in NAD prior to dc. Letter to patient's employer stating patient does not meet CBC criteria for testing for Corona virus.   Final Clinical Impressions(s) / UC Diagnoses   Final diagnoses:  Cough   Discharge Instructions   None    ED Prescriptions     Medication Sig Dispense Auth. Provider   benzonatate (TESSALON) 100 MG capsule  (Status: Discontinued) Take 1 capsule (100 mg total) by mouth every 8 (eight) hours. 21 capsule Garwood, [place] M, NP   benzonatate (TESSALON) 100 MG capsule Take 1 capsule (100 mg total) by mouth every 8 (eight) hours. 21 capsule Janne Napoleon, NP     Controlled Substance Prescriptions [place] Controlled Substance Registry consulted? Not Applicable  Kerrie [place], [place] 07/16/18 1323

## 2018-08-06 MED FILL — FERROUS SULFATE 325 MG TAB: 325 (65 FE) | 30 days supply | Qty: 30 | Fill #1

## 2018-08-06 MED FILL — LISINOPRIL-HCTZ 20-25 MG TA: 20-25 | 30 days supply | Qty: 30 | Fill #2

## 2018-08-06 MED FILL — ATORVASTATIN CALCIUM 40 MG: 40 | 30 days supply | Qty: 30 | Fill #2

## 2018-09-13 ENCOUNTER — Other Ambulatory Visit: Payer: Self-pay | Admitting: Internal Medicine

## 2018-09-13 MED FILL — LISINOPRIL-HCTZ 20-25 MG TA: 20-25 | 30 days supply | Qty: 30 | Fill #0

## 2018-09-13 MED FILL — ATORVASTATIN CALCIUM 40 MG: 40 | 30 days supply | Qty: 30 | Fill #0

## 2018-09-13 MED FILL — FERROUS SULFATE 325 MG TAB: 325 (65 FE) | 30 days supply | Qty: 30 | Fill #2

## 2018-10-19 MED FILL — ATORVASTATIN CALCIUM 40 MG: 40 | 30 days supply | Qty: 30 | Fill #1

## 2018-10-19 MED FILL — LISINOPRIL-HCTZ 20-25 MG TA: 20-25 | 30 days supply | Qty: 30 | Fill #1

## 2018-10-19 MED FILL — FERROUS SULFATE 325 MG TAB: 325 (65 FE) | 30 days supply | Qty: 30 | Fill #3

## 2018-11-22 MED FILL — ATORVASTATIN CALCIUM 40 MG: 40 | 30 days supply | Qty: 30 | Fill #2

## 2018-11-22 MED FILL — LISINOPRIL-HCTZ 20-25 MG TA: 20-25 | 30 days supply | Qty: 30 | Fill #2

## 2018-11-22 MED FILL — FERROUS SULFATE 325 MG TAB: 325 (65 FE) | 30 days supply | Qty: 30 | Fill #4

## 2018-12-28 ENCOUNTER — Telehealth: Payer: Self-pay | Admitting: Internal Medicine

## 2018-12-28 DIAGNOSIS — D509 Iron deficiency anemia, unspecified: Secondary | ICD-10-CM

## 2018-12-28 NOTE — Telephone Encounter (Signed)
New Message   1) Medication(s) Requested (by name): ferrous sulfate (FERROUSUL) 325 (65 FE) MG tablet, lisinopril-hydrochlorothiazide (ZESTORETIC) 20-25 MG tablet and atorvastatin (LIPITOR) 40 MG tablet  2) Pharmacy of Choice: [facility]  3) Special Requests: Pt is requesting a refill until his appt on 10/1   Approved medications will be sent to the pharmacy, we will reach out if there is an issue.  Requests made after 3pm may not be addressed until the following business day!  If a patient is unsure of the name of the medication(s) please note and ask patient to call back when they are able to provide all info, do not send to responsible party until all information is available!

## 2018-12-29 MED ORDER — FERROUS SULFATE 325 (65 FE) MG PO TABS: 325.0000 mg | ORAL_TABLET | Freq: Every day | ORAL | 0 refills | Status: DC

## 2018-12-29 MED ORDER — ATORVASTATIN CALCIUM 40 MG PO TABS: 40.0000 mg | ORAL_TABLET | Freq: Every day | ORAL | 1 refills | Status: DC

## 2018-12-29 MED ORDER — LISINOPRIL-HYDROCHLOROTHIAZIDE 20-25 MG PO TABS: 1.0000 | ORAL_TABLET | Freq: Every day | ORAL | 1 refills | Status: DC

## 2018-12-29 MED FILL — LISINOPRIL-HCTZ 20-25 MG TA: 20-25 | 30 days supply | Qty: 30 | Fill #0

## 2018-12-29 MED FILL — ATORVASTATIN CALCIUM 40 MG: 40 | 30 days supply | Qty: 30 | Fill #0

## 2018-12-29 MED FILL — FERROUS SULFATE 325 MG TAB: 325 (65 FE) | 30 days supply | Qty: 30 | Fill #0

## 2018-12-29 NOTE — Telephone Encounter (Signed)
Refills sent to last until appt  

## 2019-02-03 ENCOUNTER — Ambulatory Visit: Payer: Self-pay | Admitting: Internal Medicine

## 2019-02-07 MED FILL — ATORVASTATIN CALCIUM 40 MG: 40 | 30 days supply | Qty: 30 | Fill #1

## 2019-02-07 MED FILL — LISINOPRIL-HCTZ 20-25 MG TA: 20-25 | 30 days supply | Qty: 30 | Fill #1

## 2019-02-07 MED FILL — FERROUS SULFATE 325 MG TAB: 325 (65 FE) | 30 days supply | Qty: 30 | Fill #1

## 2019-03-11 ENCOUNTER — Ambulatory Visit: Payer: Self-pay | Attending: Internal Medicine | Admitting: Internal Medicine

## 2019-03-11 ENCOUNTER — Other Ambulatory Visit: Payer: Self-pay

## 2019-03-11 ENCOUNTER — Encounter: Payer: Self-pay | Admitting: Internal Medicine

## 2019-03-11 ENCOUNTER — Ambulatory Visit (HOSPITAL_BASED_OUTPATIENT_CLINIC_OR_DEPARTMENT_OTHER): Payer: Self-pay | Admitting: Pharmacist

## 2019-03-11 VITALS — BP 171/104 | HR 99 | Temp 98.5°F | Resp 16 | Wt 169.2 lb

## 2019-03-11 DIAGNOSIS — I10 Essential (primary) hypertension: Secondary | ICD-10-CM

## 2019-03-11 DIAGNOSIS — D509 Iron deficiency anemia, unspecified: Secondary | ICD-10-CM

## 2019-03-11 DIAGNOSIS — R634 Abnormal weight loss: Secondary | ICD-10-CM

## 2019-03-11 DIAGNOSIS — Z23 Encounter for immunization: Secondary | ICD-10-CM

## 2019-03-11 DIAGNOSIS — E782 Mixed hyperlipidemia: Secondary | ICD-10-CM

## 2019-03-11 DIAGNOSIS — Z2821 Immunization not carried out because of patient refusal: Secondary | ICD-10-CM

## 2019-03-11 MED ORDER — LISINOPRIL-HYDROCHLOROTHIAZIDE 20-25 MG PO TABS: 1.0000 | ORAL_TABLET | Freq: Every day | ORAL | 3 refills | Status: DC

## 2019-03-11 MED ORDER — ATORVASTATIN CALCIUM 40 MG PO TABS: 40.0000 mg | ORAL_TABLET | Freq: Every day | ORAL | 6 refills | Status: DC

## 2019-03-11 MED ORDER — AMLODIPINE BESYLATE 5 MG PO TABS: 5.0000 mg | ORAL_TABLET | Freq: Every day | ORAL | 6 refills | Status: DC

## 2019-03-11 MED ORDER — FERROUS SULFATE 325 (65 FE) MG PO TABS: 325.0000 mg | ORAL_TABLET | Freq: Every day | ORAL | 1 refills | Status: DC

## 2019-03-11 MED FILL — ATORVASTATIN CALCIUM 40 MG: 40 | 30 days supply | Qty: 30 | Fill #0

## 2019-03-11 MED FILL — FERROUS SULFATE 325 MG TAB: 325 (65 FE) | 30 days supply | Qty: 30 | Fill #0

## 2019-03-11 MED FILL — AMLODIPINE BESYLATE 5 MG TA: 5 | 30 days supply | Qty: 30 | Fill #0

## 2019-03-11 MED FILL — LISINOPRIL-HCTZ 20-25 MG TA: 20-25 | 30 days supply | Qty: 30 | Fill #0

## 2019-03-11 NOTE — Progress Notes (Signed)
Patient presents for vaccination against influenza per orders of Dr. Johnson. Consent given. Counseling provided. No contraindications exists. Vaccine administered without incident.   

## 2019-03-11 NOTE — Patient Instructions (Addendum)
Your blood pressure is not at goal.  We have added another blood pressure medication called amlodipine that you will take once a day.  Continue your other medications.  Check your blood pressure at least once a week and write down the numbers.  Please bring them in on your next visit.  Please complete the forms that have given you for the orange card/cone discount card.  Once you have completed them, return them to our front desk and you will be schedule an appointment to meet with our financial specialist.  Once you have been approved we will refer you to the gastroenterologist to have a colonoscopy done.  Influenza Virus Vaccine injection (Fluarix) What is this medicine? INFLUENZA VIRUS VACCINE (in floo EN zuh VAHY ruhs vak SEEN) helps to reduce the risk of getting influenza also known as the flu. This medicine may be used for other purposes; ask your health care provider or pharmacist if you have questions. COMMON BRAND NAME(S): Fluarix, Fluzone What should I tell my health care provider before I take this medicine? They need to know if you have any of these conditions:  bleeding disorder like hemophilia  fever or infection  Guillain-Barre syndrome or other neurological problems  immune system problems  infection with the human immunodeficiency virus (HIV) or AIDS  low blood platelet counts  multiple sclerosis  an unusual or allergic reaction to influenza virus vaccine, eggs, chicken proteins, latex, gentamicin, other medicines, foods, dyes or preservatives  pregnant or trying to get pregnant  breast-feeding How should I use this medicine? This vaccine is for injection into a muscle. It is given by a health care professional. A copy of Vaccine Information Statements will be given before each vaccination. Read this sheet carefully each time. The sheet may change frequently. Talk to your pediatrician regarding the use of this medicine in children. Special care may be  needed. Overdosage: If you think you have taken too much of this medicine contact a poison control center or emergency room at once. NOTE: This medicine is only for you. Do not share this medicine with others. What if I miss a dose? This does not apply. What may interact with this medicine?  chemotherapy or radiation therapy  medicines that lower your immune system like etanercept, anakinra, infliximab, and adalimumab  medicines that treat or prevent blood clots like warfarin  phenytoin  steroid medicines like prednisone or cortisone  theophylline  vaccines This list may not describe all possible interactions. Give your health care provider a list of all the medicines, herbs, non-prescription drugs, or dietary supplements you use. Also tell them if you smoke, drink alcohol, or use illegal drugs. Some items may interact with your medicine. What should I watch for while using this medicine? Report any side effects that do not go away within 3 days to your doctor or health care professional. Call your health care provider if any unusual symptoms occur within 6 weeks of receiving this vaccine. You may still catch the flu, but the illness is not usually as bad. You cannot get the flu from the vaccine. The vaccine will not protect against colds or other illnesses that may cause fever. The vaccine is needed every year. What side effects may I notice from receiving this medicine? Side effects that you should report to your doctor or health care professional as soon as possible:  allergic reactions like skin rash, itching or hives, swelling of the face, lips, or tongue Side effects that usually do not require  medical attention (report to your doctor or health care professional if they continue or are bothersome):  fever  headache  muscle aches and pains  pain, tenderness, redness, or swelling at site where injected  weak or tired This list may not describe all possible side effects.  Call your doctor for medical advice about side effects. You may report side effects to FDA at 1-800-FDA-1088. Where should I keep my medicine? This vaccine is only given in a clinic, pharmacy, doctor's office, or other health care setting and will not be stored at home. NOTE: This sheet is a summary. It may not cover all possible information. If you have questions about this medicine, talk to your doctor, pharmacist, or health care provider.  2020 Elsevier/Gold Standard (2007-11-17 09:30:40)

## 2019-03-11 NOTE — Progress Notes (Addendum)
Patient ID: Jason Kent, male    DOB: 02/21/1961  MRN: [id number]  CC: Medication Refill   Subjective: Jason Kent is a 58 y.o. male who presents for chronic disease management.  Last seen 06/2018 His concerns today include:  Patient with history of HTN, iron def nemia, tob dep, HL,   Pt presents to request RF on med including Lis/HCTZ, Lipitor, Iron  On last visit, pt advised to apply for [facility] Discount so that we could refer for c-scope due to iron def anemia and reports of blood in stool.  Last noticed blood in stools for 2 days last mth.  He has loss 12 lbs since beginning of this yr. -pt said he did not apply for [facility] discount because he did not understand what the program would do.  -he reports compliance with iron supplement  HTN:  Reports compliance with Lis/HCTZ He limits salt tin foods No CP/SOB/LE edema  HL:  Taking and tolerating Lipitor.  Patient Active Problem List   Diagnosis Date Noted   Iron deficiency anemia 06/18/2018   Mixed hyperlipidemia 06/18/2018   Essential hypertension 05/10/2018   Tobacco dependence 05/10/2018     No current outpatient medications on file prior to visit.   No current facility-administered medications on file prior to visit.     No Known Allergies  Social History   Socioeconomic History   Marital status: Married    Spouse name: Not on file   Number of children: Not on file   Years of education: Not on file   Highest education level: Not on file  Occupational History   Not on file  Social Needs   Financial resource strain: Not on file   Food insecurity    Worry: Not on file    Inability: Not on file   Transportation needs    Medical: Not on file    Non-medical: Not on file  Tobacco Use   Smoking status: Current Every Day Smoker    Packs/day: 0.25    Types: Cigarettes   Smokeless tobacco: Never Used  Substance and Sexual Activity   Alcohol use: Yes    Alcohol/week: 2.0 standard drinks      Types: 2 Cans of beer per week   Drug use: No   Sexual activity: Not on file  Lifestyle   Physical activity    Days per week: Not on file    Minutes per session: Not on file   Stress: Not on file  Relationships   Social connections    Talks on phone: Not on file    Gets together: Not on file    Attends religious service: Not on file    Active member of club or organization: Not on file    Attends meetings of clubs or organizations: Not on file    Relationship status: Not on file   Intimate partner violence    Fear of current or ex partner: Not on file    Emotionally abused: Not on file    Physically abused: Not on file    Forced sexual activity: Not on file  Other Topics Concern   Not on file  Social History Narrative   Not on file    No family history on file.  No past surgical history on file.  ROS: Review of Systems Negative except as stated above  PHYSICAL EXAM: BP (!) 171/104    Pulse 99    Temp 98.5 F (36.9 C) (Oral)    Resp  16    Wt 169 lb 3.2 oz (76.7 kg)    SpO2 100%    BMI 27.31 kg/m   Wt Readings from Last 3 Encounters:  03/11/19 169 lb 3.2 oz (76.7 kg)  06/18/18 179 lb 9.6 oz (81.5 kg)  05/10/18 181 lb 6.4 oz (82.3 kg)    Physical Exam Constitutional:      Appearance: Normal appearance.  Cardiovascular:     Rate and Rhythm: Normal rate and regular rhythm.     Pulses: Normal pulses.     Heart sounds: Normal heart sounds. No murmur.     Comments: No LE edema Pulmonary:     Effort: Pulmonary effort is normal.     Breath sounds: Normal breath sounds.  Neurological:     Mental Status: He is alert.     CMP Latest Ref Rng & Units 05/10/2018 01/15/2018  Glucose 65 - 99 mg/dL 99 833(X)  BUN 6 - 24 mg/dL 19 7  Creatinine 8.32 - 1.27 mg/dL 9.19 1.66  Sodium 060 - 144 mmol/L 136 135  Potassium 3.5 - 5.2 mmol/L 4.7 3.4(L)  Chloride 96 - 106 mmol/L 96 101  CO2 20 - 29 mmol/L 19(L) 22  Calcium 8.7 - 10.2 mg/dL 9.5 9.6  Total Protein 6.0 -  8.5 g/dL 8.2 -  Total Bilirubin 0.0 - 1.2 mg/dL 0.4 -  Alkaline Phos 39 - 117 IU/L 73 -  AST 0 - 40 IU/L 23 -  ALT 0 - 44 IU/L 20 -   Lipid Panel     Component Value Date/Time   CHOL 279 (H) 05/10/2018 1631   TRIG 219 (H) 05/10/2018 1631   HDL 59 05/10/2018 1631   CHOLHDL 4.7 05/10/2018 1631   LDLCALC 176 (H) 05/10/2018 1631    CBC    Component Value Date/Time   WBC 7.3 05/10/2018 1631   WBC 6.2 01/15/2018 1429   RBC 4.48 05/10/2018 1631   RBC 4.22 01/15/2018 1429   HGB 11.7 (L) 05/10/2018 1631   HCT 37.1 (L) 05/10/2018 1631   PLT 374 05/10/2018 1631   MCV 83 05/10/2018 1631   MCH 26.1 (L) 05/10/2018 1631   MCH 24.9 (L) 01/15/2018 1429   MCHC 31.5 05/10/2018 1631   MCHC 29.7 (L) 01/15/2018 1429   RDW 14.4 05/10/2018 1631   Lab Results  Component Value Date   IRON 31 (L) 05/10/2018   TIBC 510 (H) 05/10/2018   FERRITIN 13 (L) 05/10/2018    ASSESSMENT AND PLAN: 1. Essential hypertension Not at goal.  Continue lisinopril/HCTZ.  Add amlodipine 5 mg daily. - CBC - lisinopril-hydrochlorothiazide (ZESTORETIC) 20-25 MG tablet; Take 1 tablet by mouth daily.  Dispense: 30 tablet; Refill: 3 - amLODipine (NORVASC) 5 MG tablet; Take 1 tablet (5 mg total) by mouth daily.  Dispense: 30 tablet; Refill: 6 - Comprehensive metabolic panel  2. Iron deficiency anemia, unspecified iron deficiency anemia type I explained to the patient why he needs to have a colonoscopy done for further evaluation.  He expresses understanding.  I have given him the forms for the orange card/the cone discount.  He states he will take them home and have his son fill them out.  I have told him to return them to the front desk once they have been completed with the supporting documents and then an appointment will be made for him to see our financial specialist.  Once he is approved we can refer him to the gastroenterologist for colonoscopy.  I will bring him back  in about 6 weeks to see whether he has completed  the process - Iron, TIBC and Ferritin Panel - ferrous sulfate (FERROUSUL) 325 (65 FE) MG tablet; Take 1 tablet (325 mg total) by mouth daily with breakfast.  Dispense: 60 tablet; Refill: 1  3. Mixed hyperlipidemia - atorvastatin (LIPITOR) 40 MG tablet; Take 1 tablet (40 mg total) by mouth daily.  Dispense: 30 tablet; Refill: 6  4. Weight loss My concern is the weight loss with iron deficiency anemia and blood in the stools.  I have stressed the the patient the importance of [place] getting him to a gastroenterologist for evaluation. - TSH  5. Need for influenza vaccination  6. Tetanus, diphtheria, and acellular pertussis (Tdap) vaccination declined  Patient was given the opportunity to ask questions.  Patient verbalized understanding of the plan and was able to repeat key elements of the plan.  [employer] interpreter used during this encounter. #[id number]  Addendum:  Anemia and iron levels improved. Will have CMA inform pt and recommend change iron supplement to Mon/Wed/Fri.  TSH low.  Will add free T3/free T4 Orders Placed This Encounter  Procedures   CBC   Iron, TIBC and Ferritin Panel   Comprehensive metabolic panel   TSH     Requested Prescriptions   Signed Prescriptions Disp Refills   lisinopril-hydrochlorothiazide (ZESTORETIC) 20-25 MG tablet 30 tablet 3    Sig: Take 1 tablet by mouth daily.   ferrous sulfate (FERROUSUL) 325 (65 FE) MG tablet 60 tablet 1    Sig: Take 1 tablet (325 mg total) by mouth daily with breakfast.   atorvastatin (LIPITOR) 40 MG tablet 30 tablet 6    Sig: Take 1 tablet (40 mg total) by mouth daily.   amLODipine (NORVASC) 5 MG tablet 30 tablet 6    Sig: Take 1 tablet (5 mg total) by mouth daily.    Return in about 6 weeks (around 04/22/2019).  Karle Plumber, MD, FACP

## 2019-03-12 LAB — IRON,TIBC AND FERRITIN PANEL
Ferritin: 149 ng/mL (ref 30–400)
Iron Saturation: 27 % (ref 15–55)
Iron: 101 ug/dL (ref 38–169)
Total Iron Binding Capacity: 370 ug/dL (ref 250–450)
UIBC: 269 ug/dL (ref 111–343)

## 2019-03-12 LAB — COMPREHENSIVE METABOLIC PANEL
ALT: 32 IU/L (ref 0–44)
AST: 35 IU/L (ref 0–40)
Albumin/Globulin Ratio: 1.6 (ref 1.2–2.2)
Albumin: 4.7 g/dL (ref 3.8–4.9)
Alkaline Phosphatase: 79 IU/L (ref 39–117)
BUN/Creatinine Ratio: 14 (ref 9–20)
BUN: 10 mg/dL (ref 6–24)
Bilirubin Total: 0.6 mg/dL (ref 0.0–1.2)
CO2: 24 mmol/L (ref 20–29)
Calcium: 10 mg/dL (ref 8.7–10.2)
Chloride: 101 mmol/L (ref 96–106)
Creatinine, Ser: 0.71 mg/dL — ABNORMAL LOW (ref 0.76–1.27)
GFR calc Af Amer: 120 mL/min/{1.73_m2} (ref >59)
GFR calc non Af Amer: 104 mL/min/{1.73_m2} (ref >59)
Globulin, Total: 2.9 g/dL (ref 1.5–4.5)
Glucose: 119 mg/dL — ABNORMAL HIGH (ref 65–99)
Potassium: 3.9 mmol/L (ref 3.5–5.2)
Sodium: 140 mmol/L (ref 134–144)
Total Protein: 7.6 g/dL (ref 6.0–8.5)

## 2019-03-12 LAB — CBC
Hematocrit: 38.2 % (ref 37.5–51.0)
Hemoglobin: 12.8 g/dL — ABNORMAL LOW (ref 13.0–17.7)
MCH: 32.3 pg (ref 26.6–33.0)
MCHC: 33.5 g/dL (ref 31.5–35.7)
MCV: 97 fL (ref 79–97)
Platelets: 190 10*3/uL (ref 150–450)
RBC: 3.96 x10E6/uL — ABNORMAL LOW (ref 4.14–5.80)
RDW: 11.5 % — ABNORMAL LOW (ref 11.6–15.4)
WBC: 7.2 10*3/uL (ref 3.4–10.8)

## 2019-03-12 LAB — TSH: TSH: 0.15 u[IU]/mL — ABNORMAL LOW (ref 0.450–4.500)

## 2019-03-13 NOTE — Addendum Note (Signed)
Addended by: Karle Plumber B on: 03/13/2019 01:27 PM   Modules accepted: Orders

## 2019-03-14 ENCOUNTER — Telehealth: Payer: Self-pay

## 2019-03-14 NOTE — Telephone Encounter (Signed)
[employer] [place]  Id# [id number]  contacted pt to go over lab results was unable to get in touch with pt due to call can't be completed at this time

## 2019-03-15 LAB — SPECIMEN STATUS REPORT

## 2019-03-15 LAB — T4, FREE: Free T4: 1.04 ng/dL (ref 0.82–1.77)

## 2019-03-15 LAB — T3, FREE: T3, Free: 3.7 pg/mL (ref 2.0–4.4)

## 2019-04-18 MED FILL — LISINOPRIL-HCTZ 20-25 MG TA: 20-25 | 30 days supply | Qty: 30 | Fill #1

## 2019-04-18 MED FILL — AMLODIPINE BESYLATE 5 MG TA: 5 | 30 days supply | Qty: 30 | Fill #1

## 2019-04-18 MED FILL — FERROUS SULFATE 325 MG TAB: 325 (65 FE) | 90 days supply | Qty: 90 | Fill #1

## 2019-04-18 MED FILL — ATORVASTATIN CALCIUM 40 MG: 40 | 30 days supply | Qty: 30 | Fill #1

## 2019-04-22 ENCOUNTER — Ambulatory Visit: Payer: Self-pay | Admitting: Internal Medicine

## 2019-05-20 MED FILL — LISINOPRIL-HCTZ 20-25 MG TA: 20-25 | 30 days supply | Qty: 30 | Fill #2

## 2019-05-20 MED FILL — AMLODIPINE BESYLATE 5 MG TA: 5 | 30 days supply | Qty: 30 | Fill #2

## 2019-05-20 MED FILL — ATORVASTATIN CALCIUM 40 MG: 40 | 30 days supply | Qty: 30 | Fill #2

## 2019-07-07 MED FILL — LISINOPRIL-HCTZ 20-25 MG TA: 20-25 | 30 days supply | Qty: 30 | Fill #3

## 2019-07-07 MED FILL — ATORVASTATIN CALCIUM 40 MG: 40 | 30 days supply | Qty: 30 | Fill #3

## 2019-07-07 MED FILL — AMLODIPINE BESYLATE 5 MG TA: 5 | 30 days supply | Qty: 30 | Fill #3

## 2019-08-15 ENCOUNTER — Other Ambulatory Visit: Payer: Self-pay | Admitting: Internal Medicine

## 2019-08-15 DIAGNOSIS — I10 Essential (primary) hypertension: Secondary | ICD-10-CM

## 2019-08-15 MED FILL — AMLODIPINE BESYLATE 5 MG TA: 5 | 30 days supply | Qty: 30 | Fill #4

## 2019-08-15 MED FILL — LISINOPRIL-HCTZ 20-25 MG TA: 20-25 | 30 days supply | Qty: 30 | Fill #0

## 2019-08-15 MED FILL — ATORVASTATIN CALCIUM 40 MG: 40 | 30 days supply | Qty: 30 | Fill #4

## 2019-09-09 ENCOUNTER — Encounter (HOSPITAL_COMMUNITY): Payer: Self-pay

## 2019-09-09 ENCOUNTER — Ambulatory Visit (HOSPITAL_COMMUNITY)
Admission: EM | Admit: 2019-09-09 | Discharge: 2019-09-09 | Disposition: A | Discharge status: Home / Self Care | Payer: Self-pay | Attending: Urgent Care | Admitting: Urgent Care

## 2019-09-09 ENCOUNTER — Other Ambulatory Visit: Payer: Self-pay

## 2019-09-09 DIAGNOSIS — R030 Elevated blood-pressure reading, without diagnosis of hypertension: Secondary | ICD-10-CM

## 2019-09-09 DIAGNOSIS — I10 Essential (primary) hypertension: Secondary | ICD-10-CM

## 2019-09-09 DIAGNOSIS — R000 Tachycardia, unspecified: Secondary | ICD-10-CM

## 2019-09-09 MED ORDER — METOPROLOL TARTRATE 50 MG PO TABS: 50.0000 mg | ORAL_TABLET | Freq: Two times a day (BID) | ORAL | 0 refills | Status: DC

## 2019-09-09 NOTE — Discharge Instructions (Addendum)
Today, I am adding metoprolol to help with your blood pressure and pulse. Take this twice a day with all your other medications. Follow up with your regular doctor to continue working on your blood pressure. You may end up needing a renal artery ultrasound to see about that as a source of your persistent elevated blood pressure, heart rate. You may also need more work to see if your adrenal glands are the source of the problem. In the meantime, make sure you avoid salt in your diet. No restaurant foods, frozen meals.

## 2019-09-09 NOTE — ED Triage Notes (Signed)
Pt reports his blood pressure was high yesterday and today when he checked at home,  pt do not member the reading.

## 2019-09-09 NOTE — ED Provider Notes (Signed)
[facility]   MRN: [id number] DOB: 05/27/1960  Subjective:   Jason Kent is a 59 y.o. male presenting for blood pressure check. Has a hx of HTN, managed with lisinopril-HCTZ, amlodipine.  Patient has previously had tachycardia, no cardiac consult. Has had lab work-up including comprehensive metabolic panel, thyroid panel, CBC and iron panel.  TSH was low but T3 and T4 were normal.  He is supposed to be on iron supplementation. Smokes 2 cigarettes per day. Denies hx of diabetes, MI.   No current facility-administered medications for this encounter.  Current Outpatient Medications:  .  amLODipine (NORVASC) 5 MG tablet, Take 1 tablet (5 mg total) by mouth daily., Disp: 30 tablet, Rfl: 6 .  atorvastatin (LIPITOR) 40 MG tablet, Take 1 tablet (40 mg total) by mouth daily., Disp: 30 tablet, Rfl: 6 .  ferrous sulfate (FERROUSUL) 325 (65 FE) MG tablet, Take 1 tablet (325 mg total) by mouth daily with breakfast., Disp: 60 tablet, Rfl: 1 .  lisinopril-hydrochlorothiazide (ZESTORETIC) 20-25 MG tablet, Take 1 tablet by mouth daily. Must have office visit for refills, Disp: 30 tablet, Rfl: 0   No Known Allergies  Past Medical History:  Diagnosis Date  . Hypertension      History reviewed. No pertinent surgical history.  History reviewed. No pertinent family history.  Social History   Tobacco Use  . Smoking status: Current Every Day Smoker    Packs/day: 0.25    Types: Cigarettes  . Smokeless tobacco: Never Used  Substance Use Topics  . Alcohol use: Yes    Alcohol/week: 2.0 standard drinks    Types: 2 Cans of beer per week  . Drug use: No    Review of Systems  Constitutional: Negative for fever and malaise/fatigue.  HENT: Negative for congestion, ear pain, sinus pain and sore throat.   Eyes: Negative for blurred vision, double vision, discharge and redness.  Respiratory: Negative for cough, hemoptysis, shortness of breath and wheezing.   Cardiovascular: Negative for  chest pain.  Gastrointestinal: Negative for abdominal pain, diarrhea, nausea and vomiting.  Genitourinary: Negative for dysuria, flank pain and hematuria.  Musculoskeletal: Negative for myalgias.  Skin: Negative for rash.  Neurological: Negative for dizziness, focal weakness, weakness and headaches.  Psychiatric/Behavioral: Negative for depression and substance abuse.     Objective:   Vitals: BP (!) 151/88 (BP Location: Left Arm)   Pulse (!) 126   Temp 98.2 F (36.8 C) (Oral)   Resp 19   Wt Readings from Last 3 Encounters:  03/11/19 169 lb 3.2 oz (76.7 kg)  06/18/18 179 lb 9.6 oz (81.5 kg)  05/10/18 181 lb 6.4 oz (82.3 kg)   Temp Readings from Last 3 Encounters:  09/09/19 98.2 F (36.8 C) (Oral)  03/11/19 98.5 F (36.9 C) (Oral)  07/16/18 99 F (37.2 C) (Temporal)   BP Readings from Last 3 Encounters:  09/09/19 (!) 151/88  03/11/19 (!) 171/104  07/16/18 127/85   Pulse Readings from Last 3 Encounters:  09/09/19 (!) 126  03/11/19 99  07/16/18 (!) 107   Pulse was 118-123 on recheck by PA-Danai Gotto.   Physical Exam Constitutional:      General: He is not in acute distress.    Appearance: Normal appearance. He is well-developed. He is not ill-appearing, toxic-appearing or diaphoretic.  HENT:     Head: Normocephalic and atraumatic.     Right Ear: External ear normal.     Left Ear: External ear normal.     Nose: Nose normal.  Mouth/Throat:     Mouth: Mucous membranes are moist.     Pharynx: Oropharynx is clear.  Eyes:     General: No scleral icterus.       Right eye: No discharge.        Left eye: No discharge.     Extraocular Movements: Extraocular movements intact.     Pupils: Pupils are equal, round, and reactive to light.  Cardiovascular:     Rate and Rhythm: Normal rate and regular rhythm.     Heart sounds: Normal heart sounds. No murmur. No friction rub. No gallop.   Pulmonary:     Effort: Pulmonary effort is normal. No respiratory distress.     Breath  sounds: Normal breath sounds. No stridor. No wheezing, rhonchi or rales.  Skin:    General: Skin is warm and dry.  Neurological:     Mental Status: He is alert and oriented to person, place, and time.     Cranial Nerves: No cranial nerve deficit.     Motor: No weakness.     Coordination: Coordination normal.     Gait: Gait normal.     Deep Tendon Reflexes: Reflexes normal.  Psychiatric:        Mood and Affect: Mood normal.        Behavior: Behavior normal.        Thought Content: Thought content normal.        Judgment: Judgment normal.     ED ECG REPORT   Date: 09/09/2019  Rate: 116bpm  Rhythm: sinus tachycardia  QRS Axis: normal  Intervals: normal  ST/T Wave abnormalities: normal  Conduction Disutrbances:right bundle branch block  Narrative Interpretation: Sinus tachycardia at 116 bpm, no acute findings.  EKG actually looks improved from previous ones in 2019.  Possible incomplete right bundle branch block.  Very comparable to previous EKGs including tachycardia.  Old EKG Reviewed: unchanged  I have personally reviewed the EKG tracing and agree with the computerized printout as noted.   Assessment and Plan :   PDMP not reviewed this encounter.  1. Essential hypertension   2. Elevated blood pressure reading   3. Tachycardia     Recommend starting metoprolol at 50mg  BID, follow up with cardiologist and PCP.  Maintain current medication regimen.  Needs further work up including RA U/S, pheochromocytoma work up, . Info provided to the patient for follow up with his PCP.  Patient does not have any kind of pain or symptom, physical exam findings very reassuring and despite tachycardia will attempt outpatient management.  Counseled patient on potential for adverse effects with medications prescribed/recommended today, strict ER and return-to-clinic precautions discussed, patient verbalized understanding.    Catering manager, Wallis Bamberg 09/09/19 1737

## 2019-09-30 ENCOUNTER — Other Ambulatory Visit: Payer: Self-pay | Admitting: Internal Medicine

## 2019-09-30 DIAGNOSIS — I10 Essential (primary) hypertension: Secondary | ICD-10-CM

## 2019-09-30 MED FILL — AMLODIPINE BESYLATE 5 MG TA: 5 | 30 days supply | Qty: 30 | Fill #5

## 2019-09-30 MED FILL — ATORVASTATIN CALCIUM 40 MG: 40 | 30 days supply | Qty: 30 | Fill #5

## 2019-11-03 ENCOUNTER — Ambulatory Visit: Payer: Self-pay | Admitting: Internal Medicine

## 2019-11-10 MED FILL — AMLODIPINE BESYLATE 5 MG TA: 5 | 30 days supply | Qty: 30 | Fill #6

## 2019-11-10 MED FILL — ATORVASTATIN CALCIUM 40 MG: 40 | 30 days supply | Qty: 30 | Fill #6

## 2019-11-29 ENCOUNTER — Other Ambulatory Visit: Payer: Self-pay | Admitting: Internal Medicine

## 2019-11-29 ENCOUNTER — Other Ambulatory Visit: Payer: Self-pay

## 2019-11-29 ENCOUNTER — Ambulatory Visit: Payer: Self-pay | Attending: Internal Medicine | Admitting: Internal Medicine

## 2019-11-29 ENCOUNTER — Encounter: Payer: Self-pay | Admitting: Internal Medicine

## 2019-11-29 VITALS — BP 150/100 | HR 120 | Resp 16 | Wt 175.8 lb

## 2019-11-29 DIAGNOSIS — R7989 Other specified abnormal findings of blood chemistry: Secondary | ICD-10-CM

## 2019-11-29 DIAGNOSIS — E782 Mixed hyperlipidemia: Secondary | ICD-10-CM

## 2019-11-29 DIAGNOSIS — R000 Tachycardia, unspecified: Secondary | ICD-10-CM | POA: Insufficient documentation

## 2019-11-29 DIAGNOSIS — I10 Essential (primary) hypertension: Secondary | ICD-10-CM

## 2019-11-29 DIAGNOSIS — R739 Hyperglycemia, unspecified: Secondary | ICD-10-CM

## 2019-11-29 DIAGNOSIS — G4489 Other headache syndrome: Secondary | ICD-10-CM

## 2019-11-29 DIAGNOSIS — D509 Iron deficiency anemia, unspecified: Secondary | ICD-10-CM

## 2019-11-29 MED ORDER — CARVEDILOL 3.125 MG PO TABS: 3.1250 mg | ORAL_TABLET | Freq: Two times a day (BID) | ORAL | 6 refills | Status: DC

## 2019-11-29 MED ORDER — ATORVASTATIN CALCIUM 40 MG PO TABS: 40.0000 mg | ORAL_TABLET | Freq: Every day | ORAL | 6 refills | Status: DC

## 2019-11-29 MED ORDER — AMLODIPINE BESYLATE 5 MG PO TABS: 5.0000 mg | ORAL_TABLET | Freq: Every day | ORAL | 6 refills | Status: DC

## 2019-11-29 MED FILL — CARVEDILOL 3.125 MG TABLET: 3.125 | 30 days supply | Qty: 60 | Fill #0

## 2019-11-29 NOTE — Progress Notes (Signed)
Patient ID: Jason Kent, male    DOB: June 16, 1960  MRN: [id number]  CC: Hypertension   Subjective: Waylon Hershey is a 59 y.o. male who presents for chronic ds management His concerns today include:  Patient with history of HTN, iron def anemia, tob dep, mixed HL, Last seen 03/2019  Pt c/o frontal HA and dizziness sometimes at work x 15 days.  HA last 15-30 mins.  No associated N/V, blurred vision or light sensitivity. He has been checking BP recently and it has been high. Gives reading of 145/102 today.  Tells me he has been taking BP meds however, he only has bottle of Norvasc and Atorvastatin with him.  Does not have the Lisinopril/HCTZ.  He tells me he has been out of this for a while -Seen in the emergency room back in May for elevated blood pressure.  They continued amlodipine and started metoprolol because of pulse rate over 100.  EKG revealed sinus tachycardia.  Patient had abnormal TSH last year.  However free T3 and free T4 were normal.  Plan was to repeat TSH on follow-up visit.  Patient reports that sometimes he can feel that his pulse rate is up.  He states it occurs when his blood pressure is elevated.  He tells me that his blood pressure did well while he was on the metoprolol but they had only given him a 1 month supply from the ER.  Anemia:  On last visit, he was told to apply for [facility] discount and f/u in 6 wks so we could refer for c-scope.  He did not come back.  He tells me his son has applied for insurance for him and they are waiting to hear.  Still sees blood in stools about 2 x/6 mths.  No wgh loss -he is no longer taking iron supplement   Patient Active Problem List   Diagnosis Date Noted   Iron deficiency anemia 06/18/2018   Mixed hyperlipidemia 06/18/2018   Essential hypertension 05/10/2018   Tobacco dependence 05/10/2018     Current Outpatient Medications on File Prior to Visit  Medication Sig Dispense Refill   [DISCONTINUED] ferrous sulfate  (FERROUSUL) 325 (65 FE) MG tablet Take 1 tablet (325 mg total) by mouth daily with breakfast. 60 tablet 1   No current facility-administered medications on file prior to visit.    No Known Allergies  Social History   Socioeconomic History   Marital status: Married    Spouse name: Not on file   Number of children: Not on file   Years of education: Not on file   Highest education level: Not on file  Occupational History   Not on file  Tobacco Use   Smoking status: Current Every Day Smoker    Packs/day: 0.25    Types: Cigarettes   Smokeless tobacco: Never Used  Vaping Use   Vaping Use: Never used  Substance and Sexual Activity   Alcohol use: Yes    Alcohol/week: 2.0 standard drinks    Types: 2 Cans of beer per week   Drug use: No   Sexual activity: Not on file  Other Topics Concern   Not on file  Social History Narrative   Not on file   Social Determinants of Health   Financial Resource Strain:    Difficulty of Paying Living Expenses:   Food Insecurity:    Worried About Radiation protection practitioner of Food in the Last Year:    Ran Out of Food in the Last Year:  Transportation Needs:    Freight forwarder (Medical):    Lack of Transportation (Non-Medical):   Physical Activity:    Days of Exercise per Week:    Minutes of Exercise per Session:   Stress:    Feeling of Stress :   Social Connections:    Frequency of Communication with Friends and Family:    Frequency of Social Gatherings with Friends and Family:    Attends Religious Services:    Active Member of Clubs or Organizations:    Attends Engineer, structural:    Marital Status:   Intimate Partner Violence:    Fear of Current or Ex-Partner:    Emotionally Abused:    Physically Abused:    Sexually Abused:     No family history on file.  No past surgical history on file.  ROS: Review of Systems Negative except as stated above  PHYSICAL EXAM: BP (!) 150/100    Pulse (!)  120    Resp 16    Wt 175 lb 12.8 oz (79.7 kg)    SpO2 98%    BMI 28.37 kg/m   Wt Readings from Last 3 Encounters:  11/29/19 175 lb 12.8 oz (79.7 kg)  03/11/19 169 lb 3.2 oz (76.7 kg)  06/18/18 179 lb 9.6 oz (81.5 kg)    Physical Exam General appearance - alert, well appearing, and in no distress Mental status - normal mood, behavior, speech, dress, motor activity, and thought processes Eyes - pupils equal and reactive, extraocular eye movements intact.  Slightly pale conjunctiva Nose - normal and patent, no erythema, discharge or polyps Mouth - mucous membranes moist, pharynx normal without lesions Neck - supple, no significant adenopathy Chest - clear to auscultation, no wheezes, rales or rhonchi, symmetric air entry Heart -tachycardic but regular.  normal S1, S2, no murmurs, rubs, clicks or gallops Extremities - peripheral pulses normal, no pedal edema, no clubbing or cyanosis Neurologic exam: Cranial nerves grossly intact.  Power 5/5 bilaterally in both upper and lower extremities.  Gait is normal.  Patient with a mild body tremor.  He appears nervous. CMP Latest Ref Rng & Units 03/11/2019 05/10/2018 01/15/2018  Glucose 65 - 99 mg/dL 466(Z) 99 993(T)  BUN 6 - 24 mg/dL 10 19 7   Creatinine 0.76 - 1.27 mg/dL ) 7.01(X 7.93  Sodium 134 - 144 mmol/L 140 136 135  Potassium 3.5 - 5.2 mmol/L 3.9 4.7 3.4(L)  Chloride 96 - 106 mmol/L 101 96 101  CO2 20 - 29 mmol/L 24 19(L) 22  Calcium 8.7 - 10.2 mg/dL 9.03 9.5 9.6  Total Protein 6.0 - 8.5 g/dL 7.6 8.2 -  Total Bilirubin 0.0 - 1.2 mg/dL 0.6 0.4 -  Alkaline Phos 39 - 117 IU/L 79 73 -  AST 0 - 40 IU/L 35 23 -  ALT 0 - 44 IU/L 32 20 -   Lipid Panel     Component Value Date/Time   CHOL 279 (H) 05/10/2018 1631   TRIG 219 (H) 05/10/2018 1631   HDL 59 05/10/2018 1631   CHOLHDL 4.7 05/10/2018 1631   LDLCALC 176 (H) 05/10/2018 1631    CBC    Component Value Date/Time   WBC 7.2 03/11/2019 1428   WBC 6.2 01/15/2018 1429   RBC 3.96 (L)  03/11/2019 1428   RBC 4.22 01/15/2018 1429   HGB 12.8 (L) 03/11/2019 1428   HCT 38.2 03/11/2019 1428   PLT 190 03/11/2019 1428   MCV 97 03/11/2019 1428   MCH 32.3 03/11/2019  1428   MCH 24.9 (L) 01/15/2018 1429   MCHC 33.5 03/11/2019 1428   MCHC 29.7 (L) 01/15/2018 1429   RDW 11.5 (L) 03/11/2019 1428    ASSESSMENT AND PLAN: 1. Essential hypertension Not at goal.  Continue amlodipine.  Add carvedilol due to sinus tachycardia.  DASH diet discussed and encouraged.  Follow-up with clinical pharmacist in 2 weeks for repeat blood pressure check.  If blood pressure not at goal of 130/80 or lower, I would increase the amlodipine. - Comprehensive metabolic panel - carvedilol (COREG) 3.125 MG tablet; Take 1 tablet (3.125 mg total) by mouth 2 (two) times daily with a meal.  Dispense: 60 tablet; Refill: 6 - amLODipine (NORVASC) 5 MG tablet; Take 1 tablet (5 mg total) by mouth daily.  Dispense: 30 tablet; Refill: 6  2. Headache syndrome Likely due to uncontrolled blood pressure.  I will reassess him in about a month to see whether this resolves with improved blood pressure.  3. Tachycardia Recheck TSH.  If not improved with carvedilol, will refer to cardiology - TSH+T4F+T3Free  4. Iron deficiency anemia, unspecified iron deficiency anemia type Discussed with patient the importance of getting him to the gastroenterologist to have a colonoscopy.  Hopefully by the time I see him again in 1 month he would have had his insurance card so that we can submit the referral - CBC  5. Abnormal TSH - TSH+T4F+T3Free  6. Mixed hyperlipidemia - atorvastatin (LIPITOR) 40 MG tablet; Take 1 tablet (40 mg total) by mouth daily.  Dispense: 30 tablet; Refill: 6 - Lipid panel    Patient was given the opportunity to ask questions.  Patient verbalized understanding of the plan and was able to repeat key elements of the plan.  [employer] interpreter used during this encounter. #[id number]  Orders Placed This Encounter    Procedures   CBC   Comprehensive metabolic panel   [id number]   Lipid panel     Requested Prescriptions   Signed Prescriptions Disp Refills   carvedilol (COREG) 3.125 MG tablet 60 tablet 6    Sig: Take 1 tablet (3.125 mg total) by mouth 2 (two) times daily with a meal.   atorvastatin (LIPITOR) 40 MG tablet 30 tablet 6    Sig: Take 1 tablet (40 mg total) by mouth daily.   amLODipine (NORVASC) 5 MG tablet 30 tablet 6    Sig: Take 1 tablet (5 mg total) by mouth daily.    Return in about 1 month (around 12/30/2019) for [facility] in 2 wks for BP check.  Jonah Blue, MD, FACP

## 2019-11-30 LAB — LIPID PANEL
Chol/HDL Ratio: 2.3 ratio (ref 0.0–5.0)
Cholesterol, Total: 145 mg/dL (ref 100–199)
HDL: 62 mg/dL (ref >39)
LDL Chol Calc (NIH): 47 mg/dL (ref 0–99)
Triglycerides: 229 mg/dL — ABNORMAL HIGH (ref 0–149)
VLDL Cholesterol Cal: 36 mg/dL (ref 5–40)

## 2019-11-30 LAB — COMPREHENSIVE METABOLIC PANEL
ALT: 28 IU/L (ref 0–44)
AST: 62 IU/L — ABNORMAL HIGH (ref 0–40)
Albumin/Globulin Ratio: 1.6 (ref 1.2–2.2)
Albumin: 4.7 g/dL (ref 3.8–4.9)
Alkaline Phosphatase: 115 IU/L (ref 48–121)
BUN/Creatinine Ratio: 14 (ref 9–20)
BUN: 12 mg/dL (ref 6–24)
Bilirubin Total: 2 mg/dL — ABNORMAL HIGH (ref 0.0–1.2)
CO2: 19 mmol/L — ABNORMAL LOW (ref 20–29)
Calcium: 9.6 mg/dL (ref 8.7–10.2)
Chloride: 100 mmol/L (ref 96–106)
Creatinine, Ser: 0.86 mg/dL (ref 0.76–1.27)
GFR calc Af Amer: 111 mL/min/{1.73_m2} (ref >59)
GFR calc non Af Amer: 96 mL/min/{1.73_m2} (ref >59)
Globulin, Total: 3 g/dL (ref 1.5–4.5)
Glucose: 110 mg/dL — ABNORMAL HIGH (ref 65–99)
Potassium: 4 mmol/L (ref 3.5–5.2)
Sodium: 137 mmol/L (ref 134–144)
Total Protein: 7.7 g/dL (ref 6.0–8.5)

## 2019-11-30 LAB — CBC
Hematocrit: 42.2 % (ref 37.5–51.0)
Hemoglobin: 13.9 g/dL (ref 13.0–17.7)
MCH: 30.5 pg (ref 26.6–33.0)
MCHC: 32.9 g/dL (ref 31.5–35.7)
MCV: 93 fL (ref 79–97)
Platelets: 170 10*3/uL (ref 150–450)
RBC: 4.55 x10E6/uL (ref 4.14–5.80)
RDW: 14.6 % (ref 11.6–15.4)
WBC: 7.6 10*3/uL (ref 3.4–10.8)

## 2019-11-30 LAB — TSH+T4F+T3FREE
Free T4: 1.24 ng/dL (ref 0.82–1.77)
T3, Free: 3.6 pg/mL (ref 2.0–4.4)
TSH: 0.549 u[IU]/mL (ref 0.450–4.500)

## 2019-11-30 NOTE — Addendum Note (Signed)
Addended by: Jonah Blue B on: 11/30/2019 09:44 AM   Modules accepted: Orders

## 2019-11-30 NOTE — Progress Notes (Signed)
Let patient know that his blood cells are normal and he is no longer anemic.  Kidney function is normal.  He has mild elevation in 2 of his liver function tests.  We will do some additional testing on the blood that has been already drawn to evaluate this further.  Thyroid level has normalized.  Cholesterol level normal.  Blood sugar mildly elevated.  We will screen for diabetes and blood that has already been drawn.

## 2019-12-05 ENCOUNTER — Telehealth: Payer: Self-pay

## 2019-12-05 NOTE — Telephone Encounter (Signed)
[employer] interpre[employer] [place]  Id# [phone]  contacted pt to go over lab results was unable to reach pt due to phone number not in service

## 2019-12-12 MED FILL — AMLODIPINE BESYLATE 5 MG TA: 5 | 30 days supply | Qty: 30 | Fill #0

## 2019-12-12 MED FILL — ATORVASTATIN CALCIUM 40 MG: 40 | 30 days supply | Qty: 30 | Fill #0

## 2019-12-13 ENCOUNTER — Ambulatory Visit: Payer: Self-pay | Admitting: Pharmacist

## 2020-01-13 MED FILL — ATORVASTATIN CALCIUM 40 MG: 40 | 30 days supply | Qty: 30 | Fill #1

## 2020-01-13 MED FILL — CARVEDILOL 3.125 MG TABLET: 3.125 | 30 days supply | Qty: 60 | Fill #1

## 2020-01-13 MED FILL — AMLODIPINE BESYLATE 5 MG TA: 5 | 30 days supply | Qty: 30 | Fill #1

## 2020-01-20 ENCOUNTER — Ambulatory Visit: Payer: Self-pay | Admitting: Internal Medicine

## 2020-02-15 MED FILL — AMLODIPINE BESYLATE 5 MG TA: 5 | 30 days supply | Qty: 30 | Fill #2

## 2020-02-15 MED FILL — CARVEDILOL 3.125 MG TABLET: 3.125 | 30 days supply | Qty: 60 | Fill #2

## 2020-02-15 MED FILL — ATORVASTATIN CALCIUM 40 MG: 40 | 30 days supply | Qty: 30 | Fill #2

## 2020-03-19 MED FILL — ATORVASTATIN CALCIUM 40 MG: 40 | 30 days supply | Qty: 30 | Fill #3

## 2020-03-19 MED FILL — AMLODIPINE BESYLATE 5 MG TA: 5 | 30 days supply | Qty: 30 | Fill #3

## 2020-03-19 MED FILL — CARVEDILOL 3.125 MG TABLET: 3.125 | 30 days supply | Qty: 60 | Fill #3

## 2020-04-19 MED FILL — AMLODIPINE BESYLATE 5 MG TA: 5 | 30 days supply | Qty: 30 | Fill #4

## 2020-04-19 MED FILL — ATORVASTATIN CALCIUM 40 MG: 40 | 30 days supply | Qty: 30 | Fill #4

## 2020-04-19 MED FILL — CARVEDILOL 3.125 MG TABLET: 3.125 | 30 days supply | Qty: 60 | Fill #4

## 2020-05-18 MED FILL — ATORVASTATIN CALCIUM 40 MG: 40 | 30 days supply | Qty: 30 | Fill #5

## 2020-05-18 MED FILL — AMLODIPINE BESYLATE 5 MG TA: 5 | 30 days supply | Qty: 30 | Fill #5

## 2020-05-18 MED FILL — CARVEDILOL 3.125 MG TABLET: 3.125 | 30 days supply | Qty: 60 | Fill #5

## 2020-05-23 IMAGING — CT CT HEAD W/O CM
4 series · 16 of 47 positions shown, 18 images · non-contrast
Comparison: None.

CLINICAL DATA: Generalized headache and dizziness

EXAM:
CT HEAD WITHOUT CONTRAST
TECHNIQUE: Contiguous axial images were obtained from the base of the skull
through the vertex without intravenous contrast.

[Series 3: head wo · axial · 0.44mm/px · z∈[-244,-124]mm · 7 of 32 slices shown, 9 images]
[im 4/32  brain]
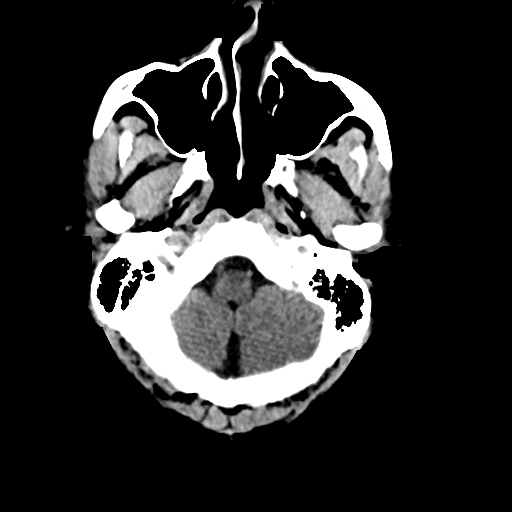
[im 4/32  bone]
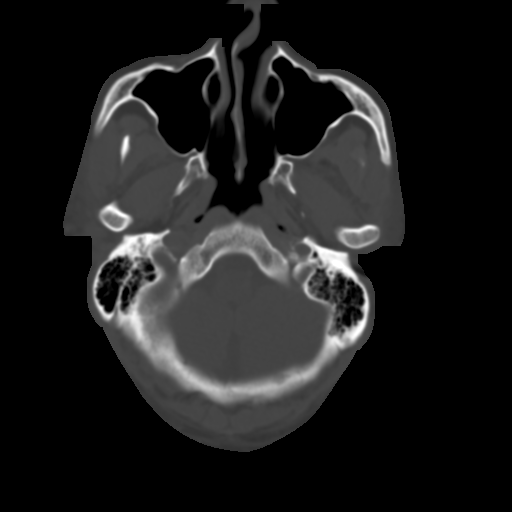
[im 8/32  brain]
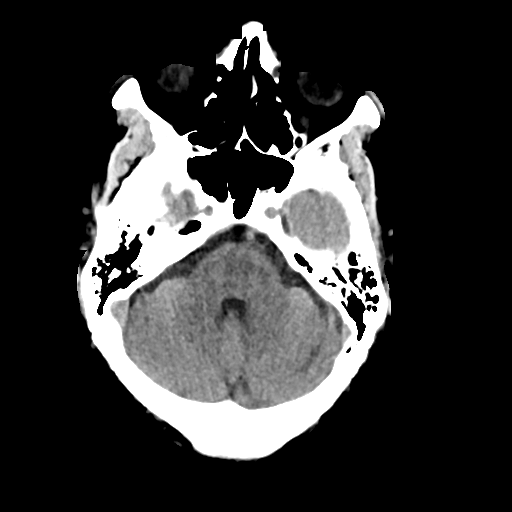
[im 12/32  brain]
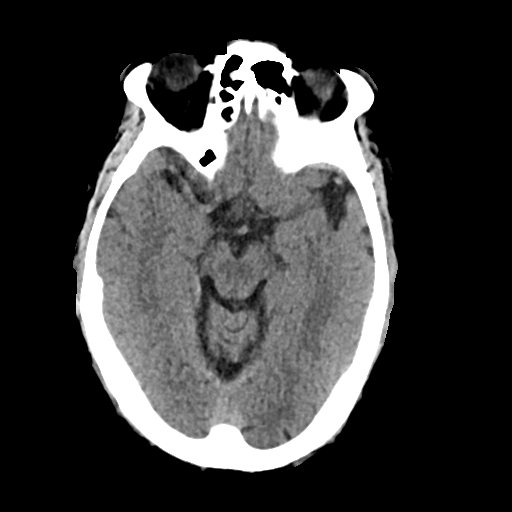
[im 16/32  brain]
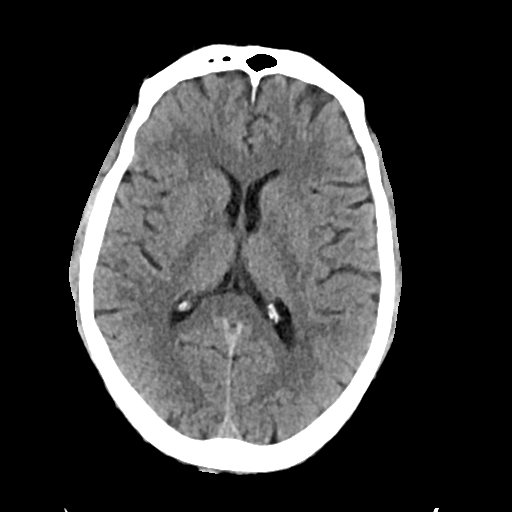
[im 20/32  brain]
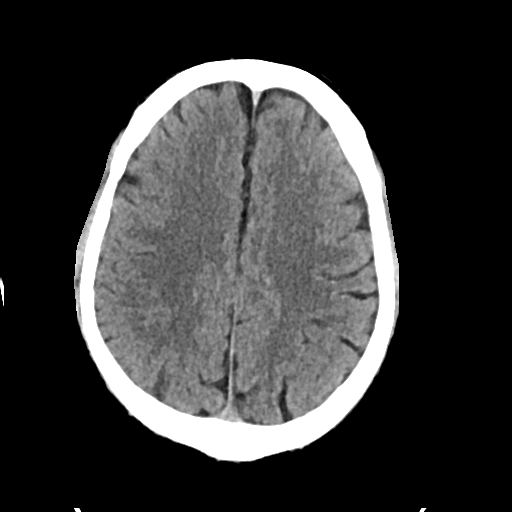
[im 20/32  bone]
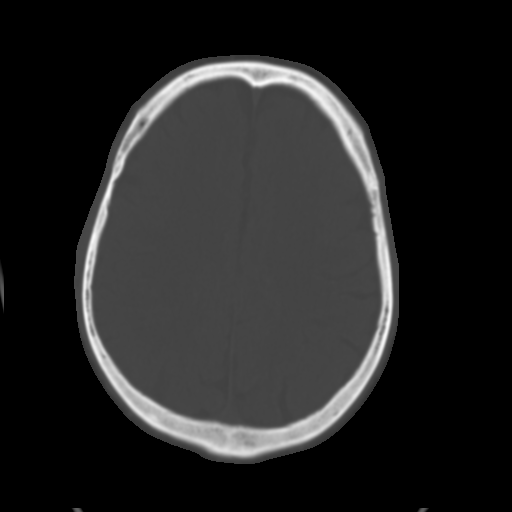
[im 24/32  brain]
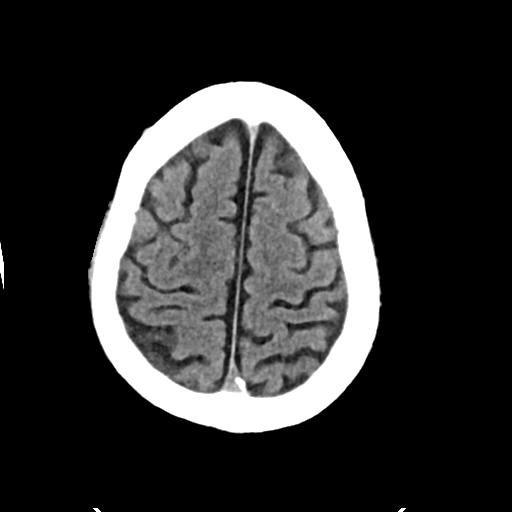
[im 28/32  brain]
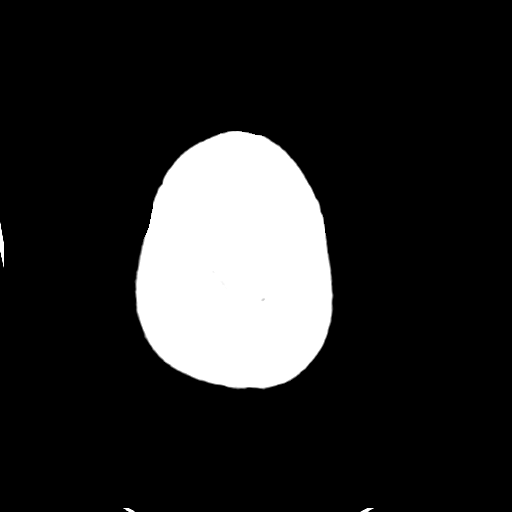

[Series 4: head bone · axial · 0.44mm/px · z∈[-244,-212]mm · 3 of 79 slices shown]
[im 8/79  bone]
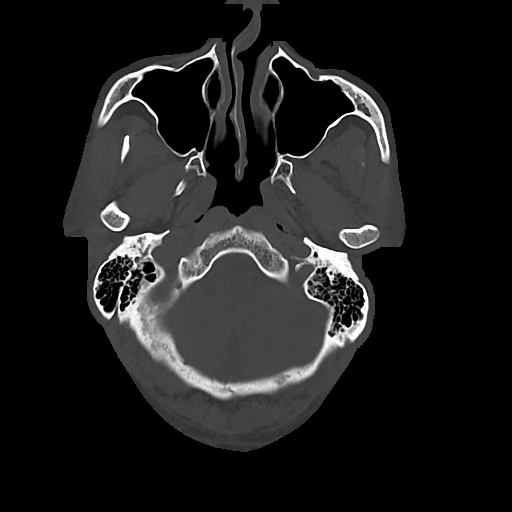
[im 16/79  bone]
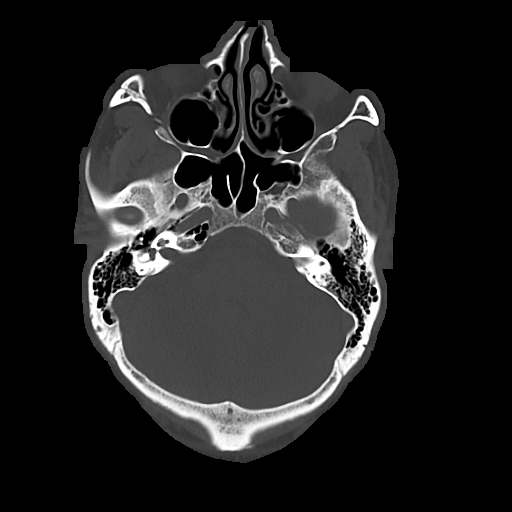
[im 24/79  bone]
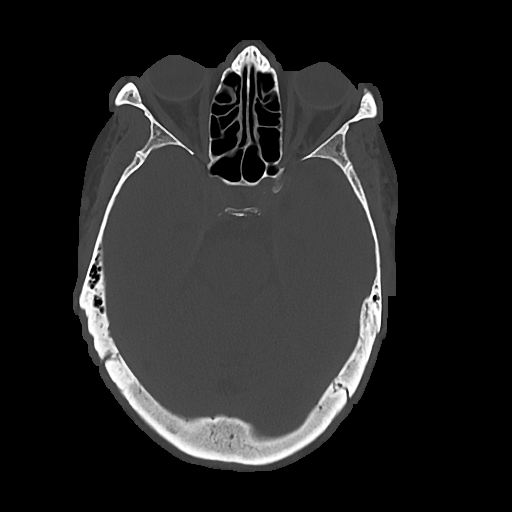

[Series 5: cor soft · coronal · 0.32mm/px · 3 of 80 slices shown]
[im 27/80  brain]
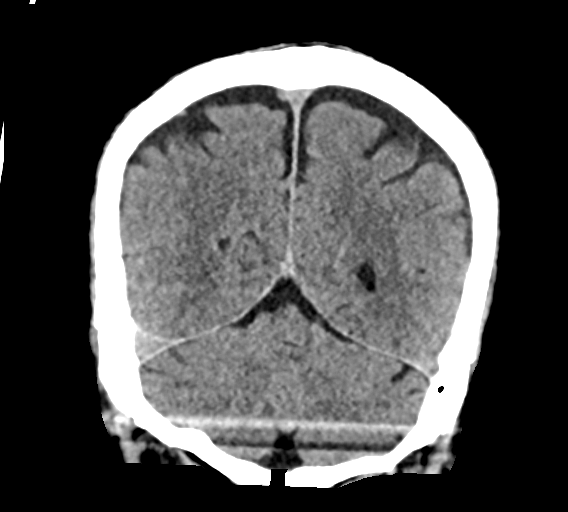
[im 36/80  brain]
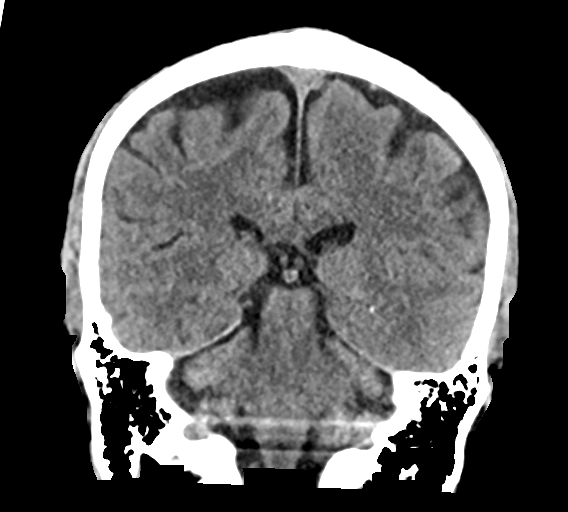
[im 44/80  brain]
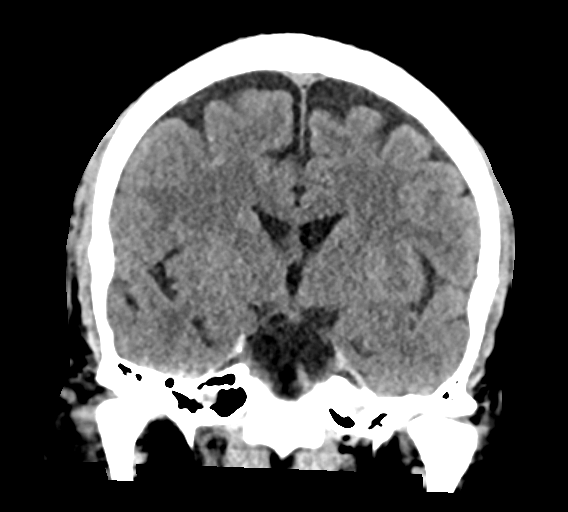

[Series 6: sag soft · sagittal · 0.33mm/px · 3 of 62 slices shown]
[im 21/62  brain]
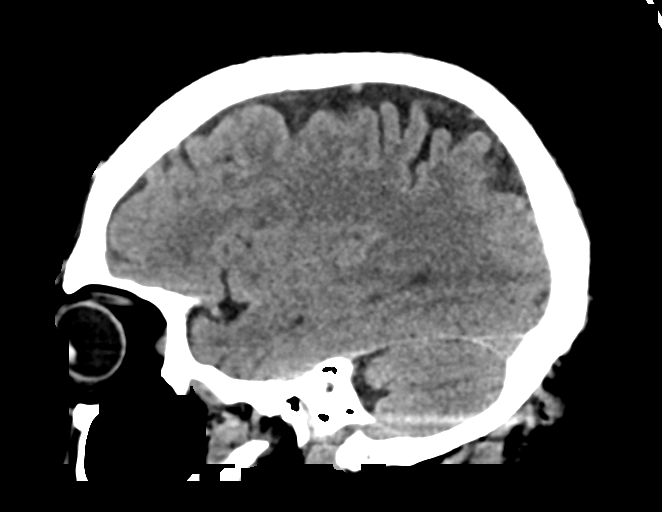
[im 31/62  brain]
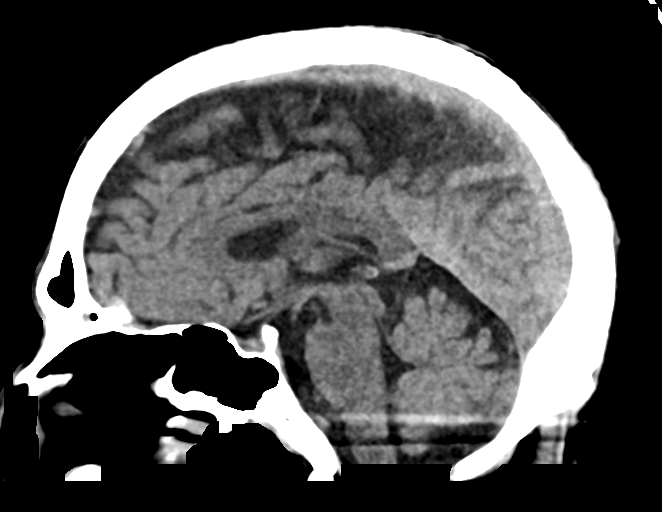
[im 41/62  brain]
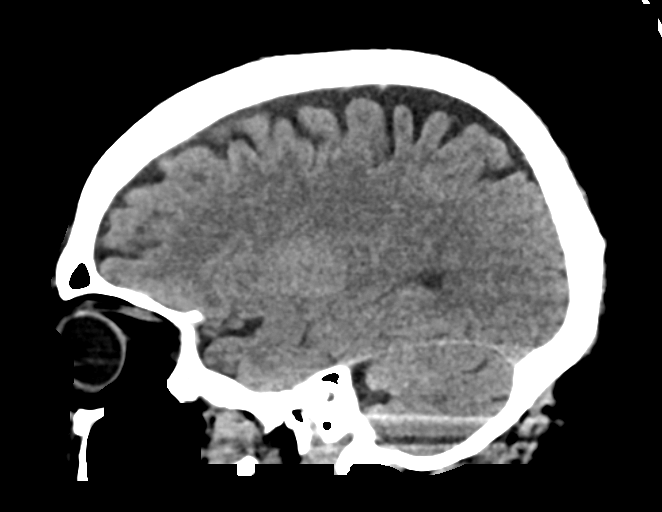

[16 of 47 positions shown; findings below may reference images not displayed]

FINDINGS: Brain: No mass effect, midline shift, or acute intracranial
hemorrhage.

Vascular: No hyperdense vessel or unexpected calcification.

Skull: Intact.

Sinuses/Orbits: Nasal septum deviated to the right. Mucous retention
cysts and mucosal thickening in the right maxillary sinus. Partial
opacification of the left posterior ethmoid air cell. Mastoid air
cells and remainder of the visualized paranasal sinuses are clear.

Other: Noncontributory.
IMPRESSION: No acute intracranial pathology.

## 2020-06-20 MED FILL — AMLODIPINE BESYLATE 5 MG TA: 5 | 30 days supply | Qty: 30 | Fill #6

## 2020-06-20 MED FILL — CARVEDILOL 3.125 MG TABLET: 3.125 | 30 days supply | Qty: 60 | Fill #6

## 2020-06-20 MED FILL — ATORVASTATIN CALCIUM 40 MG: 40 | 30 days supply | Qty: 30 | Fill #6

## 2020-07-24 ENCOUNTER — Other Ambulatory Visit: Payer: Self-pay | Admitting: Internal Medicine

## 2020-07-24 DIAGNOSIS — I10 Essential (primary) hypertension: Secondary | ICD-10-CM

## 2020-07-24 DIAGNOSIS — E782 Mixed hyperlipidemia: Secondary | ICD-10-CM

## 2020-07-24 NOTE — Telephone Encounter (Signed)
Requested medication (s) are due for refill today:yes  Requested medication (s) are on the active medication list: yes  Last refill:  06/20/2020  Future visit scheduled: no  Notes to clinic: Patient has already been giving courtesy refill No appointment has been scheduled    Requested Prescriptions  Pending Prescriptions Disp Refills   carvedilol (COREG) 3.125 MG tablet [Pharmacy Med Name: CARVEDILOL 3.125 MG TABLET 3.125 Tablet] 60 tablet 6    Sig: Take 1 tablet (3.125 mg total) by mouth 2 (two) times daily with a meal.      Cardiovascular:  Beta Blockers Failed - 07/24/2020  7:15 AM      Failed - Last BP in normal range    BP Readings from Last 1 Encounters:  11/29/19 (!) 150/100          Failed - Last Heart Rate in normal range    Pulse Readings from Last 1 Encounters:  11/29/19 (!) 120          Failed - Valid encounter within last 6 months    Recent Outpatient Visits           7 months ago Essential hypertension   [facility] Marcine Matar, MD   1 year ago Need for influenza vaccination   [facility] Lois Huxley, Cornelius Moras, RPH-CPP   1 year ago Essential hypertension   [facility] Marcine Matar, MD   2 years ago Essential hypertension   [facility] Marcine Matar, MD   2 years ago Essential hypertension   [facility] [place], Stephen L, RPH-CPP                  atorvastatin (LIPITOR) 40 MG tablet [Pharmacy Med Name: ATORVASTATIN CALCIUM 40 MG 40 Tablet] 30 tablet 6    Sig: Take 1 tablet (40 mg total) by mouth daily.      Cardiovascular:  Antilipid - Statins Failed - 07/24/2020  7:15 AM      Failed - LDL in normal range and within 360 days    LDL Chol Calc (NIH)  Date Value Ref Range Status  11/29/2019 47 0 - 99 mg/dL Final          Failed - Triglycerides in normal range and within 360  days    Triglycerides  Date Value Ref Range Status  11/29/2019 229 (H) 0 - 149 mg/dL Final          Passed - Total Cholesterol in normal range and within 360 days    Cholesterol, Total  Date Value Ref Range Status  11/29/2019 145 100 - 199 mg/dL Final          Passed - HDL in normal range and within 360 days    HDL  Date Value Ref Range Status  11/29/2019 62 >39 mg/dL Final          Passed - Patient is not pregnant      Passed - Valid encounter within last 12 months    Recent Outpatient Visits           7 months ago Essential hypertension   [facility] Marcine Matar, MD   1 year ago Need for influenza vaccination   [facility] Lois Huxley, Cornelius Moras, RPH-CPP   1 year ago Essential hypertension   [facility] Marcine Matar, MD   2 years ago Essential hypertension   [facility] Marcine Matar, MD   2 years ago Essential hypertension   [facility] Lois Huxley, Jeannett Senior L, RPH-CPP                  amLODipine (NORVASC) 5 MG tablet [Pharmacy Med Name: AMLODIPINE BESYLATE 5 MG TA 5 Tablet] 30 tablet 6    Sig: Take 1 tablet (5 mg total) by mouth daily.      Cardiovascular:  Calcium Channel Blockers Failed - 07/24/2020  7:15 AM      Failed - Last BP in normal range    BP Readings from Last 1 Encounters:  11/29/19 (!) 150/100          Failed - Valid encounter within last 6 months    Recent Outpatient Visits           7 months ago Essential hypertension   [facility] Marcine Matar, MD   1 year ago Need for influenza vaccination   [facility] Lois Huxley, Cornelius Moras, RPH-CPP   1 year ago Essential hypertension   [place] Marcine Matar, MD   2 years ago Essential hypertension   [place] [facility] Marcine Matar, MD   2 years ago Essential hypertension   [facility] Drucilla Chalet, RPH-CPP

## 2020-07-25 ENCOUNTER — Other Ambulatory Visit: Payer: Self-pay | Admitting: Internal Medicine

## 2020-07-25 MED FILL — CARVEDILOL 3.125 MG TABLET: 3.125 | 30 days supply | Qty: 60 | Fill #0

## 2020-07-25 MED FILL — ATORVASTATIN CALCIUM 40 MG: 40 | 30 days supply | Qty: 30 | Fill #0

## 2020-07-25 MED FILL — AMLODIPINE BESYLATE 5 MG TA: 5 | 30 days supply | Qty: 30 | Fill #0

## 2020-09-03 ENCOUNTER — Telehealth: Payer: Self-pay | Admitting: *Deleted

## 2020-09-03 ENCOUNTER — Other Ambulatory Visit: Payer: Self-pay | Admitting: Internal Medicine

## 2020-09-03 ENCOUNTER — Other Ambulatory Visit: Payer: Self-pay

## 2020-09-03 DIAGNOSIS — E782 Mixed hyperlipidemia: Secondary | ICD-10-CM

## 2020-09-03 DIAGNOSIS — I10 Essential (primary) hypertension: Secondary | ICD-10-CM

## 2020-09-03 MED ORDER — AMLODIPINE BESYLATE 5 MG PO TABS
ORAL_TABLET | ORAL | 0 refills | Status: DC
  Filled 2020-09-03: qty 30, 30d supply, fill #0

## 2020-09-03 MED ORDER — CARVEDILOL 3.125 MG PO TABS
ORAL_TABLET | ORAL | 0 refills | Status: DC
  Filled 2020-09-03: qty 60, 30d supply, fill #0

## 2020-09-03 MED ORDER — ATORVASTATIN CALCIUM 40 MG PO TABS
ORAL_TABLET | ORAL | 0 refills | Status: DC
  Filled 2020-09-03: qty 30, 30d supply, fill #0

## 2020-09-03 NOTE — Telephone Encounter (Signed)
No working number to reach the patient regarding scheduling appointment in order to continue refills. TC to his son, OM. Scheduled earliest appointment available 10/30/20 @ 3:30p with pcp. He will attend with his father to interpret visit. I will provide courtesy supply until visit.

## 2020-09-03 NOTE — Telephone Encounter (Signed)
Notes to clinic:  Patient has appt schedule for 10/30/2020 Review for refill until that appt No labs since 11/2019   Requested Prescriptions  Pending Prescriptions Disp Refills   carvedilol (COREG) 3.125 MG tablet 60 tablet 0    Sig: TAKE 1 TABLET (3.125 MG TOTAL) BY MOUTH 2 (TWO) TIMES DAILY WITH A MEAL. MUST HAVE OFFICE VISIT FOR REFILLS      Cardiovascular:  Beta Blockers Failed - 09/03/2020 12:38 PM      Failed - Last BP in normal range    BP Readings from Last 1 Encounters:  11/29/19 (!) 150/100          Failed - Last Heart Rate in normal range    Pulse Readings from Last 1 Encounters:  11/29/19 (!) 120          Failed - Valid encounter within last 6 months    Recent Outpatient Visits           9 months ago Essential hypertension   [facility] Marcine Matar, MD   1 year ago Need for influenza vaccination   [facility] Lois Huxley, Cornelius Moras, RPH-CPP   1 year ago Essential hypertension   [facility] Marcine Matar, MD   2 years ago Essential hypertension   [facility] Marcine Matar, MD   2 years ago Essential hypertension   [facility] Lois Huxley, Cornelius Moras, RPH-CPP       Future Appointments             In 1 month Marcine Matar, MD [facility]               amLODipine (NORVASC) 5 MG tablet 30 tablet 0    Sig: TAKE 1 TABLET (5 MG TOTAL) BY MOUTH DAILY. MUST HAVE OFFICE VISIT FOR REFILLS      Cardiovascular:  Calcium Channel Blockers Failed - 09/03/2020 12:38 PM      Failed - Last BP in normal range    BP Readings from Last 1 Encounters:  11/29/19 (!) 150/100          Failed - Valid encounter within last 6 months    Recent Outpatient Visits           9 months ago Essential hypertension   [facility] Marcine Matar, MD    1 year ago Need for influenza vaccination   [facility] Lois Huxley, Cornelius Moras, RPH-CPP   1 year ago Essential hypertension   [facility] Marcine Matar, MD   2 years ago Essential hypertension   [facility] Marcine Matar, MD   2 years ago Essential hypertension   [facility] Lois Huxley, Cornelius Moras, RPH-CPP       Future Appointments             In 1 month Marcine Matar, MD  [facility]               atorvastatin (LIPITOR) 40 MG tablet 30 tablet 0    Sig: TAKE 1 TABLET (40 MG TOTAL) BY MOUTH DAILY. MUST HAVE OFFICE VISIT FOR REFILLS      Cardiovascular:  Antilipid - Statins Failed - 09/03/2020 12:38 PM  Failed - Triglycerides in normal range and within 360 days    Triglycerides  Date Value Ref Range Status  11/29/2019 229 (H) 0 - 149 mg/dL Final          Passed - Total Cholesterol in normal range and within 360 days    Cholesterol, Total  Date Value Ref Range Status  11/29/2019 145 100 - 199 mg/dL Final          Passed - LDL in normal range and within 360 days    LDL Chol Calc (NIH)  Date Value Ref Range Status  11/29/2019 47 0 - 99 mg/dL Final          Passed - HDL in normal range and within 360 days    HDL  Date Value Ref Range Status  11/29/2019 62 >39 mg/dL Final          Passed - Patient is not pregnant      Passed - Valid encounter within last 12 months    Recent Outpatient Visits           9 months ago Essential hypertension   [facility] Marcine Matar, MD   1 year ago Need for influenza vaccination   [facility] Lois Huxley, Cornelius Moras, RPH-CPP   1 year ago Essential hypertension   [facility] Marcine Matar, MD   2 years ago Essential hypertension   [facility] Marcine Matar, MD   2 years ago Essential hypertension   [facility] Lois Huxley, Cornelius Moras, RPH-CPP       Future Appointments             In 1 month Laural Benes, Binnie Rail, MD [facility]

## 2020-09-04 ENCOUNTER — Other Ambulatory Visit: Payer: Self-pay

## 2020-10-09 ENCOUNTER — Other Ambulatory Visit: Payer: Self-pay | Admitting: Internal Medicine

## 2020-10-09 ENCOUNTER — Other Ambulatory Visit: Payer: Self-pay

## 2020-10-09 DIAGNOSIS — E782 Mixed hyperlipidemia: Secondary | ICD-10-CM

## 2020-10-09 DIAGNOSIS — I10 Essential (primary) hypertension: Secondary | ICD-10-CM

## 2020-10-09 MED ORDER — AMLODIPINE BESYLATE 5 MG PO TABS
ORAL_TABLET | ORAL | 0 refills | Status: DC
  Filled 2020-10-09: qty 30, 30d supply, fill #0

## 2020-10-09 MED ORDER — ATORVASTATIN CALCIUM 40 MG PO TABS
ORAL_TABLET | ORAL | 0 refills | Status: DC
  Filled 2020-10-09: qty 30, 30d supply, fill #0

## 2020-10-09 MED ORDER — CARVEDILOL 3.125 MG PO TABS
ORAL_TABLET | ORAL | 0 refills | Status: DC
  Filled 2020-10-09: qty 60, 30d supply, fill #0

## 2020-10-10 ENCOUNTER — Other Ambulatory Visit: Payer: Self-pay

## 2020-10-30 ENCOUNTER — Other Ambulatory Visit: Payer: Self-pay

## 2020-10-30 ENCOUNTER — Encounter: Payer: Self-pay | Admitting: Internal Medicine

## 2020-10-30 ENCOUNTER — Ambulatory Visit: Payer: Self-pay | Attending: Internal Medicine | Admitting: Internal Medicine

## 2020-10-30 VITALS — BP 140/100 | HR 93 | Resp 16 | Wt 177.8 lb

## 2020-10-30 DIAGNOSIS — F17200 Nicotine dependence, unspecified, uncomplicated: Secondary | ICD-10-CM

## 2020-10-30 DIAGNOSIS — E782 Mixed hyperlipidemia: Secondary | ICD-10-CM

## 2020-10-30 DIAGNOSIS — I10 Essential (primary) hypertension: Secondary | ICD-10-CM

## 2020-10-30 DIAGNOSIS — E663 Overweight: Secondary | ICD-10-CM

## 2020-10-30 MED ORDER — CARVEDILOL 3.125 MG PO TABS
3.1250 mg | ORAL_TABLET | Freq: Two times a day (BID) | ORAL | 6 refills | Status: DC
  Filled 2020-10-30: qty 60, 30d supply, fill #0

## 2020-10-30 MED ORDER — AMLODIPINE BESYLATE 10 MG PO TABS
10.0000 mg | ORAL_TABLET | Freq: Every day | ORAL | 6 refills | Status: DC
  Filled 2020-10-30: qty 30, 30d supply, fill #0
  Filled 2020-12-18: qty 30, 30d supply, fill #1
  Filled 2021-01-17: qty 30, 30d supply, fill #2
  Filled 2021-02-22: qty 30, 30d supply, fill #3

## 2020-10-30 MED ORDER — ATORVASTATIN CALCIUM 40 MG PO TABS
40.0000 mg | ORAL_TABLET | Freq: Every day | ORAL | 6 refills | Status: DC
  Filled 2020-10-30: qty 30, 30d supply, fill #0

## 2020-10-30 NOTE — Patient Instructions (Signed)
Your blood pressure is not controlled.  We increase the Amlodipine to 10 mg daily.

## 2020-10-30 NOTE — Progress Notes (Signed)
Patient ID: Jason Kent, male    DOB: Mar 13, 1961  MRN: [id number]  CC: No chief complaint on file.   Subjective: Jason Kent is a 60 y.o. male who presents for chronic ds management His concerns today include:  Patient with history of HTN, IDA, tobacco dependence, HL,  Pt last seen July 2021.   HTN: on last visit, pt left on Norvasc and Coreg.  Reports he is still taking and doing well on them.  Took meds already for today. Checking BP at home once a wk.  Gives range 120-140//70-80.  Reports BP yesterday was 122/80. No CP/SOB/LE/HA Reports he is very active at work.  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 BMI 28 which means he is over wgh for height Reports compliance with Lipitor.  Tolerating medication okay Smokes 2 cigarettes a day.  Started smoking 7 yrs ago.  Prior to that he chewed tobacco for many yrs.  Requests letter for work informing that he was seen today.   Patient Active Problem List   Diagnosis Date Noted   Tachycardia 11/29/2019   Headache syndrome 11/29/2019   Iron deficiency anemia 06/18/2018   Mixed hyperlipidemia 06/18/2018   Essential hypertension 05/10/2018   Tobacco dependence 05/10/2018     Current Outpatient Medications on File Prior to Visit  Medication Sig Dispense Refill   amLODipine (NORVASC) 5 MG tablet TAKE 1 TABLET (5 MG TOTAL) BY MOUTH DAILY. MUST HAVE OFFICE VISIT FOR REFILLS 30 tablet 0   atorvastatin (LIPITOR) 40 MG tablet TAKE 1 TABLET (40 MG TOTAL) BY MOUTH DAILY. MUST HAVE OFFICE VISIT FOR REFILLS 30 tablet 0   carvedilol (COREG) 3.125 MG tablet TAKE 1 TABLET (3.125 MG TOTAL) BY MOUTH 2 (TWO) TIMES DAILY WITH A MEAL. MUST HAVE OFFICE VISIT FOR REFILLS 60 tablet 0   [DISCONTINUED] ferrous sulfate (FERROUSUL) 325 (65 FE) MG tablet Take 1 tablet (325 mg total) by mouth daily with breakfast. 60 tablet 1   No current facility-administered medications on file prior to visit.    No Known  Allergies  Social History   Socioeconomic History   Marital status: Married    Spouse name: Not on file   Number of children: Not on file   Years of education: Not on file   Highest education level: Not on file  Occupational History   Not on file  Tobacco Use   Smoking status: Every Day    Packs/day: 0.25    Pack years: 0.00    Types: Cigarettes   Smokeless tobacco: Never  Vaping Use   Vaping Use: Never used  Substance and Sexual Activity   Alcohol use: Yes    Alcohol/week: 2.0 standard drinks    Types: 2 Cans of beer per week   Drug use: No   Sexual activity: Not on file  Other Topics Concern   Not on file  Social History Narrative   Not on file   Social Determinants of Health   Financial Resource Strain: Not on file  Food Insecurity: Not on file  Transportation Needs: Not on file  Physical Activity: Not on file  Stress: Not on file  Social Connections: Not on file  Intimate Partner Violence: Not on file    No family history on file.  No past surgical history on file.  ROS: Review of Systems Negative except as stated above  PHYSICAL EXAM: BP (!) 148/98   Pulse 93   Resp 16  Wt 177 lb 12.8 oz (80.6 kg)   SpO2 98%   BMI 28.70 kg/m   Wt Readings from Last 3 Encounters:  10/30/20 177 lb 12.8 oz (80.6 kg)  11/29/19 175 lb 12.8 oz (79.7 kg)  03/11/19 169 lb 3.2 oz (76.7 kg)    Physical Exam  General appearance - alert, well appearing, and in no distress Mental status - normal mood, behavior, speech, dress, motor activity, and thought processes Neck - supple, no significant adenopathy Chest - clear to auscultation, no wheezes, rales or rhonchi, symmetric air entry Heart - normal rate, regular rhythm, normal S1, S2, no murmurs, rubs, clicks or gallops Extremities - peripheral pulses normal, no pedal edema, no clubbing or cyanosis  CMP Latest Ref Rng & Units 11/29/2019 03/11/2019 05/10/2018  Glucose 65 - 99 mg/dL 937(J) 696(V) 99  BUN 6 - 24 mg/dL 12 10  19   Creatinine 0.76 - 1.27 mg/dL 8.93) 8.10(F  Sodium 134 - 144 mmol/L 137 140 136  Potassium 3.5 - 5.2 mmol/L 4.0 3.9 4.7  Chloride 96 - 106 mmol/L 100 101 96  CO2 20 - 29 mmol/L 19(L) 24 19(L)  Calcium 8.7 - 10.2 mg/dL 9.6 7.51 9.5  Total Protein 6.0 - 8.5 g/dL 7.7 7.6 8.2  Total Bilirubin 0.0 - 1.2 mg/dL 2.0(H) 0.6 0.4  Alkaline Phos 48 - 121 IU/L 115 79 73  AST 0 - 40 IU/L 62(H) 35 23  ALT 0 - 44 IU/L 28 32 20   Lipid Panel     Component Value Date/Time   CHOL 145 11/29/2019 1448   TRIG 229 (H) 11/29/2019 1448   HDL 62 11/29/2019 1448   CHOLHDL 2.3 11/29/2019 1448   LDLCALC 47 11/29/2019 1448    CBC    Component Value Date/Time   WBC 7.6 11/29/2019 1448   WBC 6.2 01/15/2018 1429   RBC 4.55 11/29/2019 1448   RBC 4.22 01/15/2018 1429   HGB 13.9 11/29/2019 1448   HCT 42.2 11/29/2019 1448   PLT 170 11/29/2019 1448   MCV 93 11/29/2019 1448   MCH 30.5 11/29/2019 1448   MCH 24.9 (L) 01/15/2018 1429   MCHC 32.9 11/29/2019 1448   MCHC 29.7 (L) 01/15/2018 1429   RDW 14.6 11/29/2019 1448    ASSESSMENT AND PLAN: 1. Essential hypertension Not at goal.  Recommend increase amlodipine to 10 mg daily.  Continue current dose of carvedilol.  Continue checking blood pressure with goal being 130/80 or lower. - CBC - Comprehensive metabolic panel - amLODipine (NORVASC) 10 MG tablet; Take 1 tablet (10 mg total) by mouth daily.  Dispense: 30 tablet; Refill: 6 - carvedilol (COREG) 3.125 MG tablet; Take 1 tablet (3.125 mg total) by mouth 2 (two) times daily with a meal.  Dispense: 60 tablet; Refill: 6  2. Mixed hyperlipidemia - Lipid panel - atorvastatin (LIPITOR) 40 MG tablet; Take 1 tablet (40 mg total) by mouth daily.  Dispense: 30 tablet; Refill: 6  3. Overweight (BMI 25.0-29.9) Discussed and encourage healthy eating habits.  Encouraged him to move as much as he can. - Hemoglobin A1c  4. Tobacco dependence Pt is current smoker. Patient advised to quit smoking. Discussed  health risks associated with smoking including lung and other types of cancers, chronic lung diseases and CV risks.. Pt ready to give trail of quitting.   Discussed methods to help quit including quitting cold 03-07-2001, use of NRT, Chantix and Bupropion.  Pt wanting to try: Patient states he will quit cold [place]. _3_ Minutes  spent on counseling. F/U: Assess progress on subsequent visit in 4 months.    Patient was given the opportunity to ask questions.  Patient verbalized understanding of the plan and was able to repeat key elements of the plan.  [employer] Language interpreter used during this encounter. #[id number], Kunjana  No orders of the defined types were placed in this encounter.    Requested Prescriptions    No prescriptions requested or ordered in this encounter    No follow-ups on file.  Jonah Blue, MD, FACP

## 2020-10-31 ENCOUNTER — Other Ambulatory Visit: Payer: Self-pay

## 2020-10-31 LAB — COMPREHENSIVE METABOLIC PANEL
ALT: 27 IU/L (ref 0–44)
AST: 49 IU/L — ABNORMAL HIGH (ref 0–40)
Albumin/Globulin Ratio: 1.8 (ref 1.2–2.2)
Albumin: 4.9 g/dL (ref 3.8–4.9)
Alkaline Phosphatase: 99 IU/L (ref 44–121)
BUN/Creatinine Ratio: 12 (ref 9–20)
BUN: 10 mg/dL (ref 6–24)
Bilirubin Total: 0.7 mg/dL (ref 0.0–1.2)
CO2: 22 mmol/L (ref 20–29)
Calcium: 9.5 mg/dL (ref 8.7–10.2)
Chloride: 101 mmol/L (ref 96–106)
Creatinine, Ser: 0.83 mg/dL (ref 0.76–1.27)
Globulin, Total: 2.7 g/dL (ref 1.5–4.5)
Glucose: 112 mg/dL — ABNORMAL HIGH (ref 65–99)
Potassium: 3.8 mmol/L (ref 3.5–5.2)
Sodium: 138 mmol/L (ref 134–144)
Total Protein: 7.6 g/dL (ref 6.0–8.5)
eGFR: 101 mL/min/{1.73_m2} (ref >59)

## 2020-10-31 LAB — LIPID PANEL
Chol/HDL Ratio: 2.4 ratio (ref 0.0–5.0)
Cholesterol, Total: 208 mg/dL — ABNORMAL HIGH (ref 100–199)
HDL: 85 mg/dL (ref >39)
LDL Chol Calc (NIH): 97 mg/dL (ref 0–99)
Triglycerides: 155 mg/dL — ABNORMAL HIGH (ref 0–149)
VLDL Cholesterol Cal: 26 mg/dL (ref 5–40)

## 2020-10-31 LAB — CBC
Hematocrit: 42.1 % (ref 37.5–51.0)
Hemoglobin: 13.9 g/dL (ref 13.0–17.7)
MCH: 32.2 pg (ref 26.6–33.0)
MCHC: 33 g/dL (ref 31.5–35.7)
MCV: 98 fL — ABNORMAL HIGH (ref 79–97)
Platelets: 226 10*3/uL (ref 150–450)
RBC: 4.32 x10E6/uL (ref 4.14–5.80)
RDW: 12.7 % (ref 11.6–15.4)
WBC: 5.5 10*3/uL (ref 3.4–10.8)

## 2020-10-31 LAB — HEMOGLOBIN A1C
Est. average glucose Bld gHb Est-mCnc: 105 mg/dL
Hgb A1c MFr Bld: 5.3 % (ref 4.8–5.6)

## 2020-10-31 NOTE — Progress Notes (Signed)
Let patient know that his blood cell counts are normal.  Kidney function normal.  He has mild elevation in one of his liver function tests but improved from 1 year ago.  Likely due to the atorvastatin.  Continue the atorvastatin.  We will continue to monitor his liver function tests at least once a year.  LDL cholesterol is 97 with goal being less than 100.  He has mild elevation in his total cholesterol at 208 with goal being less than 200.  Continue the atorvastatin and trying to eat healthy.  Diabetes screening tests normal.

## 2020-11-07 ENCOUNTER — Telehealth: Payer: Self-pay

## 2020-11-07 NOTE — Telephone Encounter (Signed)
Contacted pt to go over lab results pt didn't answer was unable to lvm   Sent a CRM and forward labs to NT to give pt labs when they call back

## 2020-11-21 ENCOUNTER — Ambulatory Visit: Payer: Self-pay | Admitting: *Deleted

## 2020-11-21 IMAGING — DX CHEST - 2 VIEW
2 series · 2 of 2 positions shown · non-contrast
Comparison: 11/03/2008

CLINICAL DATA: Productive cough, low grade fever for over a week.
No known past history

EXAM:
CHEST - 2 VIEW

[chest pa]
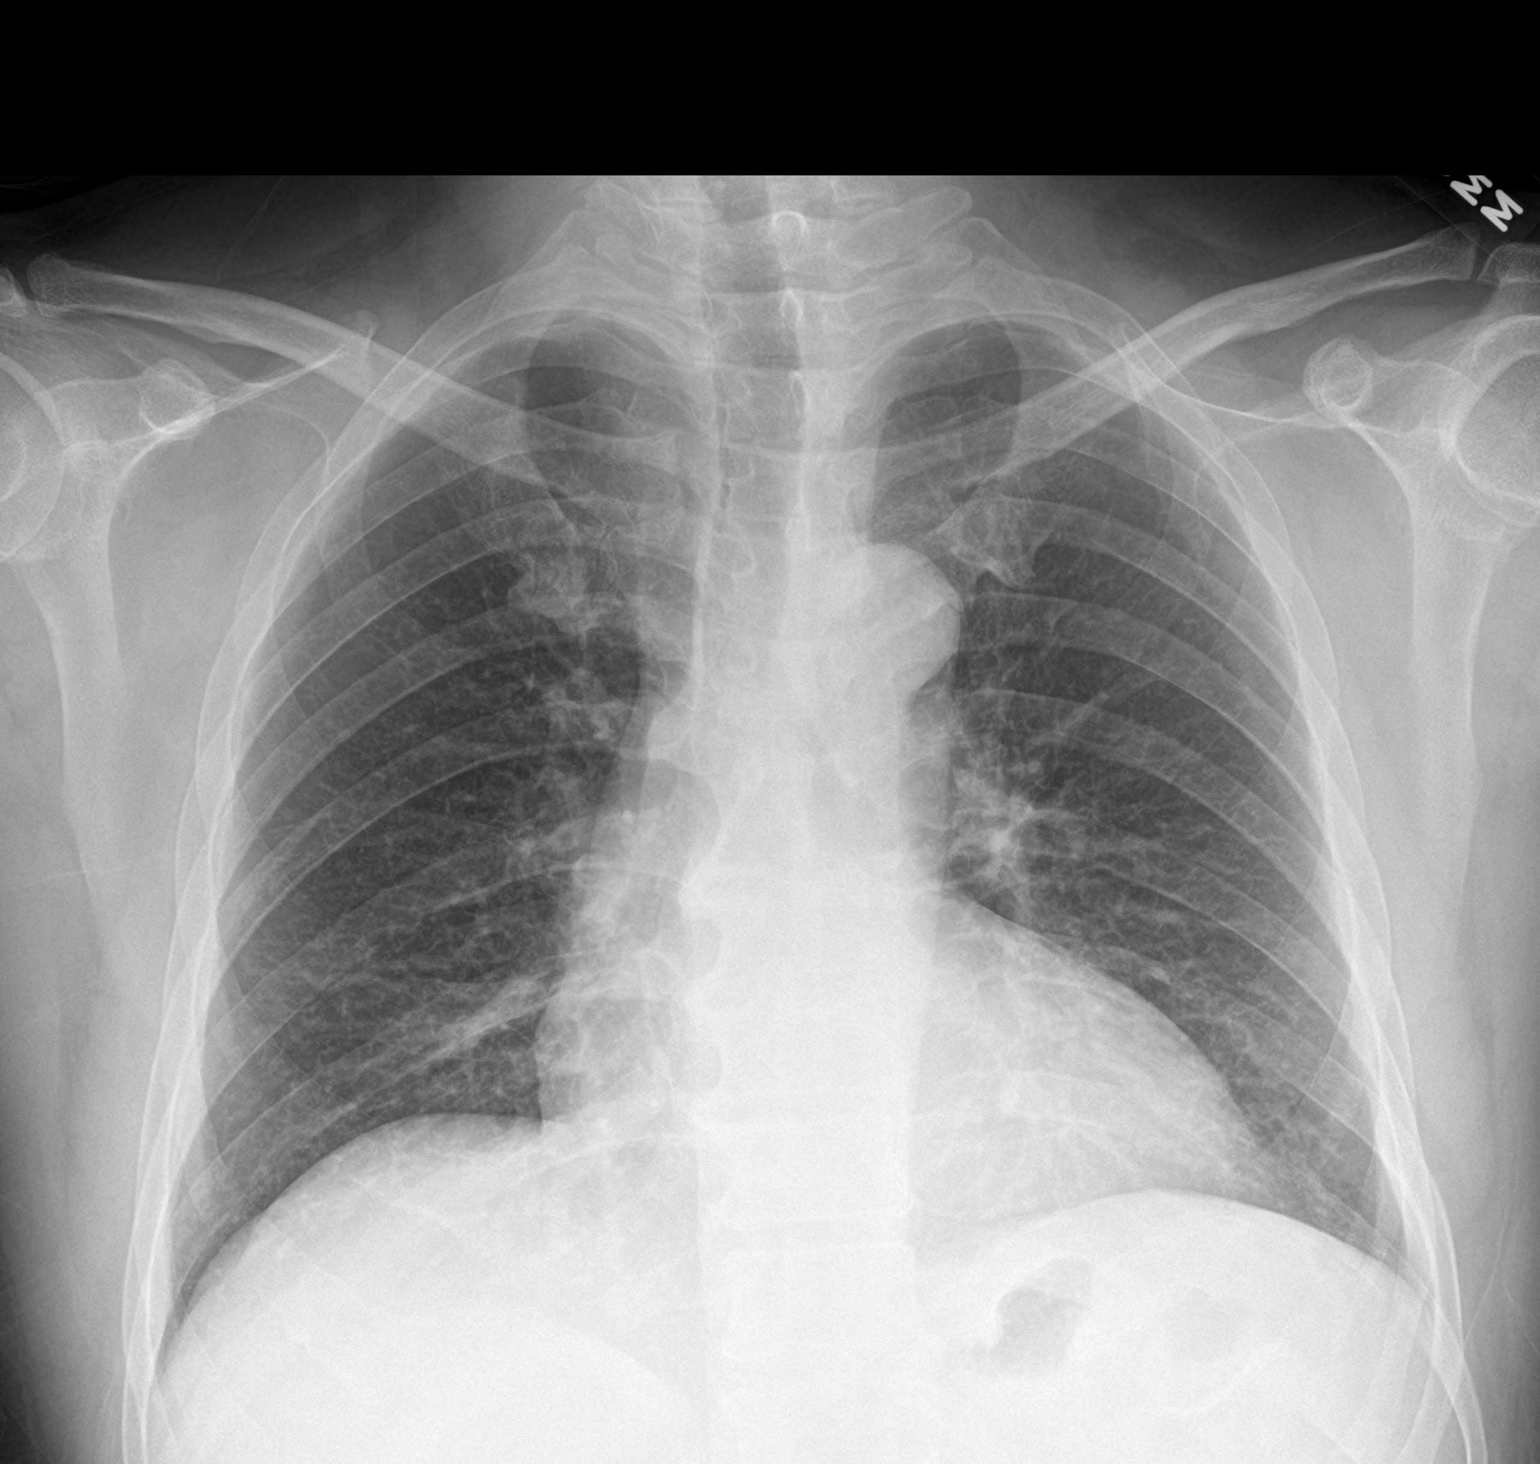

[chest lat]
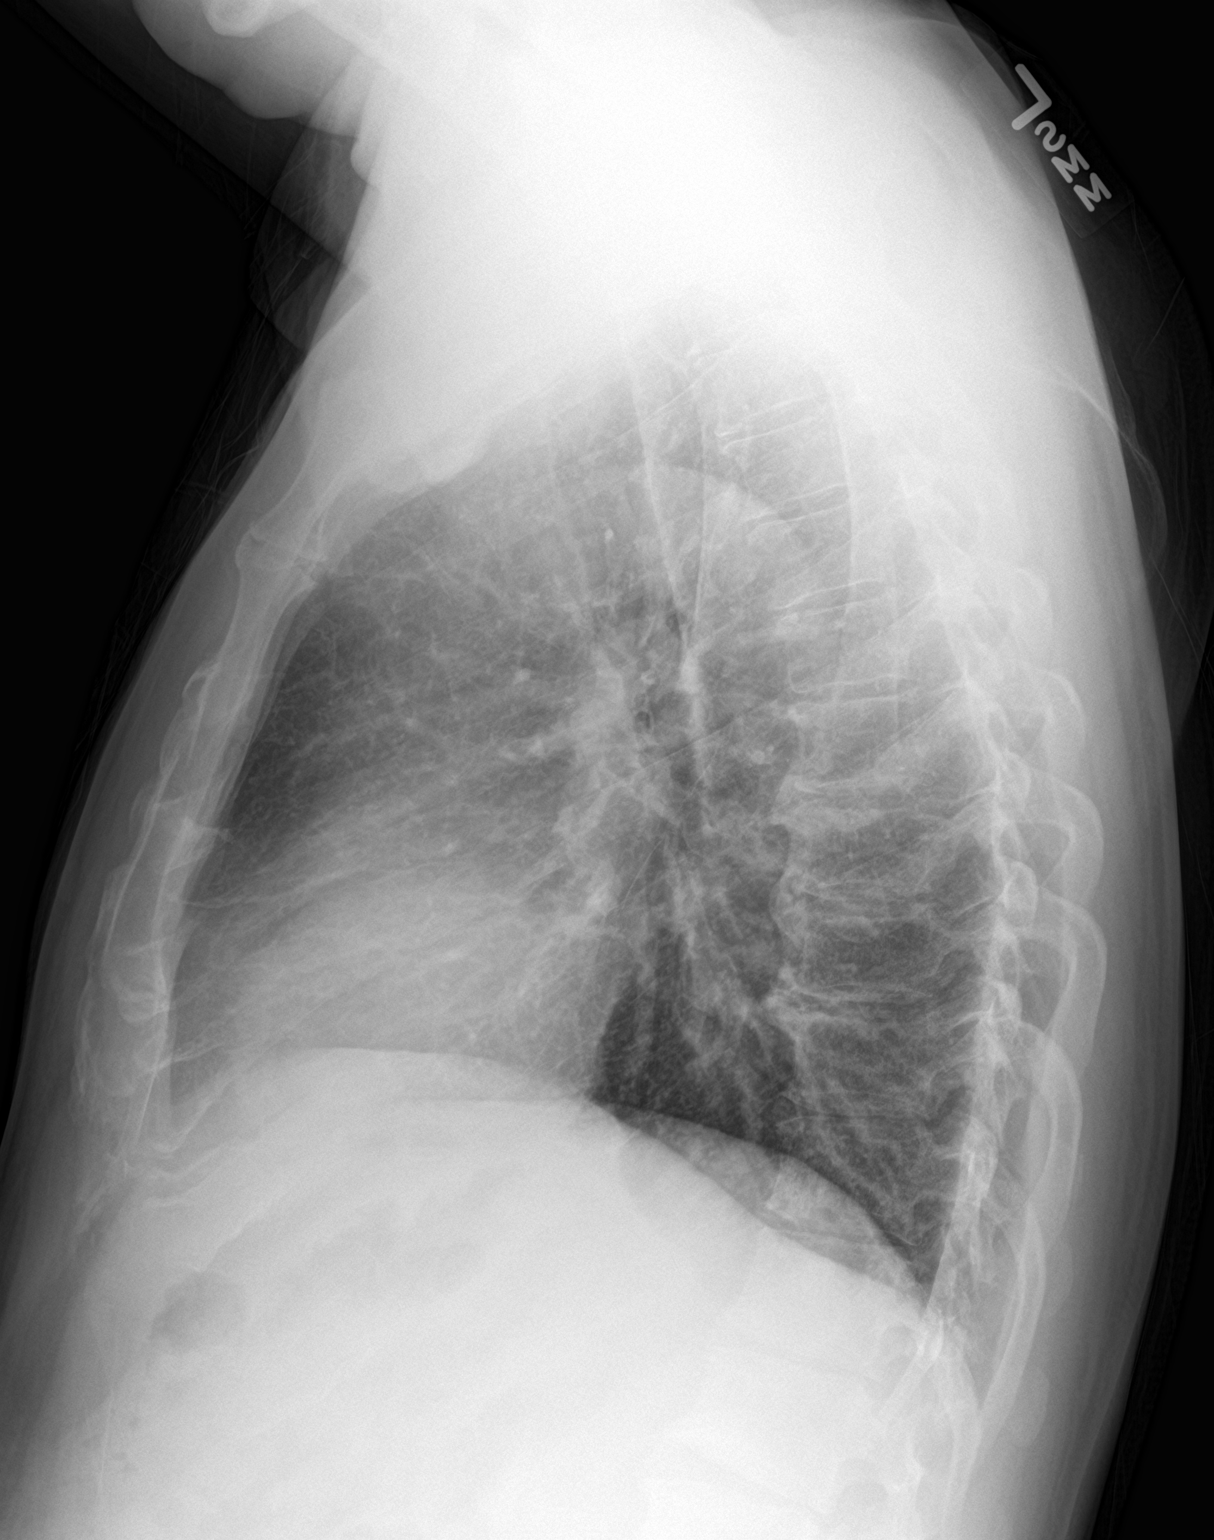

[2 of 2 positions shown; findings below may reference images not displayed]

FINDINGS: Heart size is normal. The lungs are free of focal consolidations and
pleural effusions. Degenerative changes are seen in thoracic spine.
There is anterior wedge deformity of L1, likely representing a
chronic compression injury.
IMPRESSION: No evidence for acute cardiopulmonary abnormality.

## 2020-11-21 NOTE — Telephone Encounter (Signed)
Disregard.   Opened the wrong chart.

## 2020-12-18 ENCOUNTER — Other Ambulatory Visit: Payer: Self-pay

## 2020-12-19 ENCOUNTER — Other Ambulatory Visit: Payer: Self-pay

## 2021-01-18 ENCOUNTER — Other Ambulatory Visit: Payer: Self-pay

## 2021-01-21 ENCOUNTER — Other Ambulatory Visit: Payer: Self-pay

## 2021-02-22 ENCOUNTER — Other Ambulatory Visit: Payer: Self-pay

## 2021-02-25 ENCOUNTER — Other Ambulatory Visit: Payer: Self-pay

## 2021-02-28 ENCOUNTER — Other Ambulatory Visit: Payer: Self-pay

## 2021-02-28 ENCOUNTER — Encounter: Payer: Self-pay | Admitting: Internal Medicine

## 2021-02-28 ENCOUNTER — Ambulatory Visit: Payer: Self-pay | Attending: Internal Medicine | Admitting: Internal Medicine

## 2021-02-28 VITALS — BP 148/93 | HR 90 | Resp 16 | Wt 186.4 lb

## 2021-02-28 DIAGNOSIS — Z23 Encounter for immunization: Secondary | ICD-10-CM

## 2021-02-28 DIAGNOSIS — Z5320 Procedure and treatment not carried out because of patient's decision for unspecified reasons: Secondary | ICD-10-CM

## 2021-02-28 DIAGNOSIS — I10 Essential (primary) hypertension: Secondary | ICD-10-CM

## 2021-02-28 DIAGNOSIS — E782 Mixed hyperlipidemia: Secondary | ICD-10-CM

## 2021-02-28 DIAGNOSIS — E669 Obesity, unspecified: Secondary | ICD-10-CM

## 2021-02-28 MED ORDER — AMLODIPINE BESYLATE 10 MG PO TABS
10.0000 mg | ORAL_TABLET | Freq: Every day | ORAL | 6 refills | Status: DC
  Filled 2021-02-28 – 2021-04-01 (×2): qty 30, 30d supply, fill #0
  Filled 2021-05-02: qty 30, 30d supply, fill #1
  Filled 2021-06-08: qty 30, 30d supply, fill #2
  Filled 2021-06-10: qty 30, 30d supply, fill #0
  Filled 2021-07-15: qty 30, 30d supply, fill #1
  Filled 2021-08-15: qty 30, 30d supply, fill #2
  Filled 2021-09-20: qty 30, 30d supply, fill #3
  Filled 2021-10-24: qty 30, 30d supply, fill #4

## 2021-02-28 MED ORDER — ATORVASTATIN CALCIUM 40 MG PO TABS
20.0000 mg | ORAL_TABLET | Freq: Every day | ORAL | 6 refills | Status: DC
  Filled 2021-02-28: qty 15, 30d supply, fill #0
  Filled 2021-04-01: qty 15, 30d supply, fill #1
  Filled 2021-05-02: qty 15, 30d supply, fill #2
  Filled 2021-06-08: qty 15, 30d supply, fill #3
  Filled 2021-06-10: qty 15, 30d supply, fill #0
  Filled 2021-07-15: qty 15, 30d supply, fill #1
  Filled 2021-08-15: qty 15, 30d supply, fill #2
  Filled 2021-09-20: qty 15, 30d supply, fill #3
  Filled 2021-10-24: qty 15, 30d supply, fill #4
  Filled 2021-11-29: qty 15, 30d supply, fill #5

## 2021-02-28 MED ORDER — CARVEDILOL 3.125 MG PO TABS
3.1250 mg | ORAL_TABLET | Freq: Two times a day (BID) | ORAL | 6 refills | Status: DC
  Filled 2021-02-28: qty 60, 30d supply, fill #0
  Filled 2021-04-01: qty 60, 30d supply, fill #1
  Filled 2021-05-02: qty 60, 30d supply, fill #2
  Filled 2021-06-08: qty 60, 30d supply, fill #3
  Filled 2021-06-10: qty 60, 30d supply, fill #0
  Filled 2021-07-15: qty 60, 30d supply, fill #1
  Filled 2021-08-15: qty 60, 30d supply, fill #2
  Filled 2021-09-20: qty 60, 30d supply, fill #3

## 2021-02-28 NOTE — Progress Notes (Signed)
Patient ID: Jason Kent, male    DOB: 1960/10/08  MRN: [id number]  CC: Hypertension   Subjective: Jason Kent is a 60 y.o. male who presents for chronic ds management His concerns today include:  Patient with history of HTN, IDA, tob dep HL,  HTN:  suppose to be on Norvasc and Coreg  However, pt states that the pharmacy has only been filling one of his prescriptions.  On last v I sent 6 refills on both prescriptions.  He is not sure which of the 2 medicines he has been taking but states that he took it already for today. Took med already today No CP/SOB/LE/HA/dizziness Quit smoking since last visit,  Chews tob occasionally when with friends Does a lot of lifting and walking at work.  Eats healthy.  Carries lunch form home Is a also not taking the atorvastatin.  Again he states that the pharmacy only gives him 1 medication.  Last lipid profile is shown below.  HM:  agrees for flu shot. Declines Shingles.  Will get Tdap next visit. Declines colon cancer screen  Patient Active Problem List   Diagnosis Date Noted   Tachycardia 11/29/2019   Headache syndrome 11/29/2019   Iron deficiency anemia 06/18/2018   Mixed hyperlipidemia 06/18/2018   Essential hypertension 05/10/2018   Tobacco dependence 05/10/2018     Current Outpatient Medications on File Prior to Visit  Medication Sig Dispense Refill   [DISCONTINUED] ferrous sulfate (FERROUSUL) 325 (65 FE) MG tablet Take 1 tablet (325 mg total) by mouth daily with breakfast. 60 tablet 1   No current facility-administered medications on file prior to visit.    No Known Allergies  Social History   Socioeconomic History   Marital status: Married    Spouse name: Not on file   Number of children: Not on file   Years of education: Not on file   Highest education level: Not on file  Occupational History   Not on file  Tobacco Use   Smoking status: Former    Packs/day: 0.25    Types: Cigarettes   Smokeless tobacco: Never   Vaping Use   Vaping Use: Never used  Substance and Sexual Activity   Alcohol use: Yes    Alcohol/week: 2.0 standard drinks    Types: 2 Cans of beer per week   Drug use: No   Sexual activity: Not on file  Other Topics Concern   Not on file  Social History Narrative   Not on file   Social Determinants of Health   Financial Resource Strain: Not on file  Food Insecurity: Not on file  Transportation Needs: Not on file  Physical Activity: Not on file  Stress: Not on file  Social Connections: Not on file  Intimate Partner Violence: Not on file    No family history on file.  No past surgical history on file.  ROS: Review of Systems Negative except as stated above  PHYSICAL EXAM: BP (!) 148/93   Pulse 90   Resp 16   Wt 186 lb 6.4 oz (84.6 kg)   SpO2 98%   BMI 30.09 kg/m   Wt Readings from Last 3 Encounters:  02/28/21 186 lb 6.4 oz (84.6 kg)  10/30/20 177 lb 12.8 oz (80.6 kg)  11/29/19 175 lb 12.8 oz (79.7 kg)    Physical Exam   General appearance - alert, well appearing, middle-age male overweight and in no distress Mental status - normal mood, behavior, speech, dress, motor activity, and thought  processes Neck - supple, no significant adenopathy Chest - clear to auscultation, no wheezes, rales or rhonchi, symmetric air entry Heart - normal rate, regular rhythm, normal S1, S2, no murmurs, rubs, clicks or gallops Extremities - peripheral pulses normal, no pedal edema, no clubbing or cyanosis  CMP Latest Ref Rng & Units 10/30/2020 11/29/2019 03/11/2019  Glucose 65 - 99 mg/dL 037(C) 488(Q) 916(X)  BUN 6 - 24 mg/dL 10 12 10   Creatinine 0.76 - 1.27 mg/dL 4.50 3.88)  Sodium 134 - 144 mmol/L 138 137 140  Potassium 3.5 - 5.2 mmol/L 3.8 4.0 3.9  Chloride 96 - 106 mmol/L 101 100 101  CO2 20 - 29 mmol/L 22 19(L) 24  Calcium 8.7 - 10.2 mg/dL 9.5 9.6 8.28(M  Total Protein 6.0 - 8.5 g/dL 7.6 7.7 7.6  Total Bilirubin 0.0 - 1.2 mg/dL 0.7 2.0(H) 0.6  Alkaline Phos 44 -  121 IU/L 99 115 79  AST 0 - 40 IU/L 49(H) 62(H) 35  ALT 0 - 44 IU/L 27 28 32   Lipid Panel     Component Value Date/Time   CHOL 208 (H) 10/30/2020 1603   TRIG 155 (H) 10/30/2020 1603   HDL 85 10/30/2020 1603   CHOLHDL 2.4 10/30/2020 1603   LDLCALC 97 10/30/2020 1603    CBC    Component Value Date/Time   WBC 5.5 10/30/2020 1603   WBC 6.2 01/15/2018 1429   RBC 4.32 10/30/2020 1603   RBC 4.22 01/15/2018 1429   HGB 13.9 10/30/2020 1603   HCT 42.1 10/30/2020 1603   PLT 226 10/30/2020 1603   MCV 98 (H) 10/30/2020 1603   MCH 32.2 10/30/2020 1603   MCH 24.9 (L) 01/15/2018 1429   MCHC 33.0 10/30/2020 1603   MCHC 29.7 (L) 01/15/2018 1429   RDW 12.7 10/30/2020 1603    ASSESSMENT AND PLAN: 1. Essential hypertension Not at goal.  Advised patient that he should be on both amlodipine and carvedilol.  I have resent both prescriptions to the pharmacy - amLODipine (NORVASC) 10 MG tablet; Take 1 tablet (10 mg total) by mouth daily.  Dispense: 30 tablet; Refill: 6 - carvedilol (COREG) 3.125 MG tablet; Take 1 tablet (3.125 mg total) by mouth 2 (two) times daily with a meal.  Dispense: 60 tablet; Refill: 6  2. Mixed hyperlipidemia Continue atorvastatin but we have decreased the dose to 20 mg. - atorvastatin (LIPITOR) 40 MG tablet; Take 0.5 tablets (20 mg total) by mouth daily.  Dispense: 30 tablet; Refill: 6  3. Obesity (BMI 30.0-34.9) Commended him on trying to eat healthy and good meal planning and that he takes his lunch from home.  Encouraged him to stay active.  4. Need for immunization against influenza - Flu Vaccine QUAD 44mo+IM (Fluarix, Fluzone & Alfiuria Quad PF)  5. Colon cancer screening declined Discussed colon cancer screening methods.  Patient declined screening at this time.     Patient was given the opportunity to ask questions.  Patient verbalized understanding of the plan and was able to repeat key elements of the plan.  [employer] Language interpreter used during this  encounter. 5mo, #[id number]  Orders Placed This Encounter  Procedures   Flu Vaccine QUAD 60mo+IM (Fluarix, Fluzone & Alfiuria Quad PF)     Requested Prescriptions   Signed Prescriptions Disp Refills   atorvastatin (LIPITOR) 40 MG tablet 30 tablet 6    Sig: Take 0.5 tablets (20 mg total) by mouth daily.   amLODipine (NORVASC) 10 MG tablet 30 tablet 6  Sig: Take 1 tablet (10 mg total) by mouth daily.   carvedilol (COREG) 3.125 MG tablet 60 tablet 6    Sig: Take 1 tablet (3.125 mg total) by mouth 2 (two) times daily with a meal.    Return in about 4 months (around 07/01/2021).  Jonah Blue, MD, FACP

## 2021-03-01 ENCOUNTER — Other Ambulatory Visit: Payer: Self-pay

## 2021-04-01 ENCOUNTER — Other Ambulatory Visit: Payer: Self-pay

## 2021-04-02 ENCOUNTER — Other Ambulatory Visit: Payer: Self-pay

## 2021-05-03 ENCOUNTER — Other Ambulatory Visit: Payer: Self-pay

## 2021-05-06 ENCOUNTER — Other Ambulatory Visit: Payer: Self-pay

## 2021-06-10 ENCOUNTER — Other Ambulatory Visit: Payer: Self-pay

## 2021-06-12 ENCOUNTER — Other Ambulatory Visit (HOSPITAL_COMMUNITY): Payer: Self-pay

## 2021-06-13 ENCOUNTER — Other Ambulatory Visit: Payer: Self-pay

## 2021-07-02 ENCOUNTER — Ambulatory Visit: Payer: Self-pay | Admitting: Internal Medicine

## 2021-07-16 ENCOUNTER — Other Ambulatory Visit: Payer: Self-pay

## 2021-08-16 ENCOUNTER — Other Ambulatory Visit: Payer: Self-pay

## 2021-08-21 ENCOUNTER — Other Ambulatory Visit: Payer: Self-pay

## 2021-09-12 ENCOUNTER — Ambulatory Visit: Payer: Self-pay | Admitting: Internal Medicine

## 2021-09-20 ENCOUNTER — Other Ambulatory Visit: Payer: Self-pay

## 2021-10-24 ENCOUNTER — Other Ambulatory Visit: Payer: Self-pay | Admitting: Internal Medicine

## 2021-10-24 ENCOUNTER — Other Ambulatory Visit: Payer: Self-pay

## 2021-10-24 DIAGNOSIS — I10 Essential (primary) hypertension: Secondary | ICD-10-CM

## 2021-10-24 MED ORDER — CARVEDILOL 3.125 MG PO TABS
3.1250 mg | ORAL_TABLET | Freq: Two times a day (BID) | ORAL | 2 refills | Status: DC
  Filled 2021-10-24: qty 60, 30d supply, fill #0
  Filled 2021-11-29: qty 60, 30d supply, fill #1

## 2021-10-28 ENCOUNTER — Other Ambulatory Visit: Payer: Self-pay

## 2021-11-29 ENCOUNTER — Other Ambulatory Visit: Payer: Self-pay | Admitting: Internal Medicine

## 2021-11-29 ENCOUNTER — Other Ambulatory Visit: Payer: Self-pay

## 2021-11-29 DIAGNOSIS — I10 Essential (primary) hypertension: Secondary | ICD-10-CM

## 2021-11-29 MED ORDER — AMLODIPINE BESYLATE 10 MG PO TABS
10.0000 mg | ORAL_TABLET | Freq: Every day | ORAL | 0 refills | Status: DC
  Filled 2021-11-29: qty 30, 30d supply, fill #0

## 2021-12-02 ENCOUNTER — Other Ambulatory Visit: Payer: Self-pay

## 2021-12-05 ENCOUNTER — Other Ambulatory Visit: Payer: Self-pay

## 2021-12-27 ENCOUNTER — Other Ambulatory Visit: Payer: Self-pay

## 2021-12-27 ENCOUNTER — Ambulatory Visit: Payer: Self-pay | Attending: Internal Medicine | Admitting: Internal Medicine

## 2021-12-27 ENCOUNTER — Encounter: Payer: Self-pay | Admitting: Emergency Medicine

## 2021-12-27 ENCOUNTER — Encounter: Payer: Self-pay | Admitting: Internal Medicine

## 2021-12-27 VITALS — BP 145/90 | HR 86 | Resp 16 | Ht 66.0 in | Wt 174.0 lb

## 2021-12-27 DIAGNOSIS — I10 Essential (primary) hypertension: Secondary | ICD-10-CM | POA: Insufficient documentation

## 2021-12-27 DIAGNOSIS — R079 Chest pain, unspecified: Secondary | ICD-10-CM | POA: Insufficient documentation

## 2021-12-27 DIAGNOSIS — Z1211 Encounter for screening for malignant neoplasm of colon: Secondary | ICD-10-CM | POA: Insufficient documentation

## 2021-12-27 DIAGNOSIS — R61 Generalized hyperhidrosis: Secondary | ICD-10-CM | POA: Insufficient documentation

## 2021-12-27 DIAGNOSIS — Z6828 Body mass index (BMI) 28.0-28.9, adult: Secondary | ICD-10-CM | POA: Insufficient documentation

## 2021-12-27 DIAGNOSIS — Z23 Encounter for immunization: Secondary | ICD-10-CM

## 2021-12-27 DIAGNOSIS — E782 Mixed hyperlipidemia: Secondary | ICD-10-CM | POA: Insufficient documentation

## 2021-12-27 DIAGNOSIS — F17200 Nicotine dependence, unspecified, uncomplicated: Secondary | ICD-10-CM | POA: Insufficient documentation

## 2021-12-27 DIAGNOSIS — Z720 Tobacco use: Secondary | ICD-10-CM

## 2021-12-27 DIAGNOSIS — R7303 Prediabetes: Secondary | ICD-10-CM | POA: Insufficient documentation

## 2021-12-27 DIAGNOSIS — E663 Overweight: Secondary | ICD-10-CM | POA: Insufficient documentation

## 2021-12-27 LAB — POCT GLYCOSYLATED HEMOGLOBIN (HGB A1C): Hemoglobin A1C: 5.9 % — AB (ref 4.0–5.6)

## 2021-12-27 MED ORDER — ATORVASTATIN CALCIUM 40 MG PO TABS
20.0000 mg | ORAL_TABLET | Freq: Every day | ORAL | 6 refills | Status: DC
  Filled 2021-12-27: qty 30, 60d supply, fill #0
  Filled 2022-01-27: qty 15, 30d supply, fill #1
  Filled 2022-02-28: qty 15, 30d supply, fill #2
  Filled 2022-04-06: qty 15, 30d supply, fill #3
  Filled 2022-05-19: qty 15, 30d supply, fill #4
  Filled 2022-06-20: qty 15, 30d supply, fill #5
  Filled 2022-07-28: qty 15, 30d supply, fill #6
  Filled 2022-08-29: qty 15, 30d supply, fill #7
  Filled 2022-10-07: qty 15, 30d supply, fill #8
  Filled 2022-11-17: qty 15, 30d supply, fill #9

## 2021-12-27 MED ORDER — AMLODIPINE BESYLATE 10 MG PO TABS
10.0000 mg | ORAL_TABLET | Freq: Every day | ORAL | 6 refills | Status: DC
  Filled 2021-12-27: qty 30, 30d supply, fill #0
  Filled 2022-01-27: qty 30, 30d supply, fill #1
  Filled 2022-02-28: qty 30, 30d supply, fill #2
  Filled 2022-04-06: qty 30, 30d supply, fill #3
  Filled 2022-05-19: qty 30, 30d supply, fill #4
  Filled 2022-06-20: qty 30, 30d supply, fill #5
  Filled 2022-07-28: qty 30, 30d supply, fill #6

## 2021-12-27 MED ORDER — CARVEDILOL 3.125 MG PO TABS
3.1250 mg | ORAL_TABLET | Freq: Two times a day (BID) | ORAL | 6 refills | Status: DC
  Filled 2021-12-27: qty 60, 30d supply, fill #0
  Filled 2022-01-27: qty 60, 30d supply, fill #1
  Filled 2022-02-28 (×2): qty 60, 30d supply, fill #2
  Filled 2022-04-06: qty 60, 30d supply, fill #3

## 2021-12-27 NOTE — Progress Notes (Signed)
Patient ID: Jason Kent, male    DOB: [birth year]  MRN: [id number]  CC: chronic ds management  Subjective: Jason Kent is a 61 y.o. male who presents for chronic ds management His concerns today include:  Patient with history of HTN, IDA, tobacco dependence, HL, Last seen 10/2020  HYPERTENSION Currently taking: see medication list:  suppose to be on Norvasc 10 mg and Coreg 3.125 BID.  Tells me today he is only taking one med and does not recall name.  Looks like the carvedilol was last filled in June for 30-day supply. -c/o intermittent CP x 4-5 mths. Notice it sometimes when he is at work. Works doing packing. Can also occur at rest and when driving "Pain is deep inside and throbbing and associated with diaphoresis."  Radiates to LT shoulder blade.  No SOB -denies any burning in stomach or chest with foods.  Sometimes in a.m he wakes with bitter fluid in mouth.  -no longer smokes cigarettes but does chew tob. No Fhx of heart disease.  Takes his cholesterol med Lipitor 40 mg QOD.  Prescribed as 1/2 tab PO daily.    Overweight for height but down 12 pounds since last visit according to his scale today.  However patient does not look as though he has lost 12 pounds to me.[name] he does okay with his eating habits. Patient Active Problem List   Diagnosis Date Noted   Tachycardia 11/29/2019   Headache syndrome 11/29/2019   Iron deficiency anemia 06/18/2018   Mixed hyperlipidemia 06/18/2018   Essential hypertension 05/10/2018   Tobacco dependence 05/10/2018     Current Outpatient Medications on File Prior to Visit  Medication Sig Dispense Refill   [DISCONTINUED] ferrous sulfate (FERROUSUL) 325 (65 FE) MG tablet Take 1 tablet (325 mg total) by mouth daily with breakfast. 60 tablet 1   No current facility-administered medications on file prior to visit.    No Known Allergies  Social History   Socioeconomic History   Marital status: Married    Spouse name: Not on file    Number of children: Not on file   Years of education: Not on file   Highest education level: Not on file  Occupational History   Not on file  Tobacco Use   Smoking status: Former    Packs/day: 0.25    Types: Cigarettes   Smokeless tobacco: Current    Types: Chew  Vaping Use   Vaping Use: Never used  Substance and Sexual Activity   Alcohol use: Yes    Alcohol/week: 2.0 standard drinks of alcohol    Types: 2 Cans of beer per week   Drug use: No   Sexual activity: Not on file  Other Topics Concern   Not on file  Social History Narrative   Not on file   Social Determinants of Health   Financial Resource Strain: Not on file  Food Insecurity: Not on file  Transportation Needs: Not on file  Physical Activity: Not on file  Stress: Not on file  Social Connections: Not on file  Intimate Partner Violence: Not on file    No family history on file.  No past surgical history on file.  ROS: Review of Systems Negative except as stated above  PHYSICAL EXAM: BP (!) 145/90   Pulse 86   Resp 16   Ht 5\' 6"  (1.676 m)   Wt 174 lb (78.9 kg)   SpO2 98%   BMI 28.08 kg/m   Wt Readings from  Last 3 Encounters:  12/27/21 174 lb (78.9 kg)  02/28/21 186 lb 6.4 oz (84.6 kg)  10/30/20 177 lb 12.8 oz (80.6 kg)    Physical Exam BP 150/90 General appearance - alert, well appearing, and in no distress Mental status - normal mood, behavior, speech, dress, motor activity, and thought processes Neck - supple, no significant adenopathy Chest - clear to auscultation, no wheezes, rales or rhonchi, symmetric air entry Heart - normal rate, regular rhythm, normal S1, S2, no murmurs, rubs, clicks or gallops Extremities - peripheral pulses normal, no pedal edema, no clubbing or cyanosis  Results for orders placed or performed in visit on 12/27/21  POCT glycosylated hemoglobin (Hb A1C)  Result Value Ref Range   Hemoglobin A1C 5.9 (A) 4.0 - 5.6 %   HbA1c POC (<> result, manual entry)     HbA1c,  POC (prediabetic range)     HbA1c, POC (controlled diabetic range)         Latest Ref Rng & Units 10/30/2020    4:03 PM 11/29/2019    2:48 PM 03/11/2019    2:28 PM  CMP  Glucose 65 - 99 mg/dL 892  119  417   BUN 6 - 24 mg/dL 10  12  10    Creatinine 0.76 - 1.27 mg/dL  4.08  1.44   Sodium 134 - 144 mmol/L 138  137  140   Potassium 3.5 - 5.2 mmol/L 3.8  4.0  3.9   Chloride 96 - 106 mmol/L 101  100  101   CO2 20 - 29 mmol/L 22  19  24    Calcium 8.7 - 10.2 mg/dL 9.5  9.6  8.18   Total Protein 6.0 - 8.5 g/dL 7.6  7.7  7.6   Total Bilirubin 0.0 - 1.2 mg/dL 0.7  2.0  0.6   Alkaline Phos 44 - 121 IU/L 99  115  79   AST 0 - 40 IU/L 49  62  35   ALT 0 - 44 IU/L 27  28  32    Lipid Panel     Component Value Date/Time   CHOL 208 (H) 10/30/2020 1603   TRIG 155 (H) 10/30/2020 1603   HDL 85 10/30/2020 1603   CHOLHDL 2.4 10/30/2020 1603   LDLCALC 97 10/30/2020 1603    CBC    Component Value Date/Time   WBC 5.5 10/30/2020 1603   WBC 6.2 01/15/2018 1429   RBC 4.32 10/30/2020 1603   RBC 4.22 01/15/2018 1429   HGB 13.9 10/30/2020 1603   HCT 42.1 10/30/2020 1603   PLT 226 10/30/2020 1603   MCV 98 (H) 10/30/2020 1603   MCH 32.2 10/30/2020 1603   MCH 24.9 (L) 01/15/2018 1429   MCHC 33.0 10/30/2020 1603   MCHC 29.7 (L) 01/15/2018 1429   RDW 12.7 10/30/2020 1603   EKG: Normal sinus rhythm.  Normal EKG ASSESSMENT AND PLAN: 1. Essential hypertension Not at goal. He has been out of carvedilol.  Refill given on carvedilol and amlodipine. - CBC - Comprehensive metabolic panel - carvedilol (COREG) 3.125 MG tablet; Take 1 tablet (3.125 mg total) by mouth 2 (two) times daily with a meal.  Dispense: 60 tablet; Refill: 6 - amLODipine (NORVASC) 10 MG tablet; Take 1 tablet (10 mg total) by mouth daily.  Dispense: 30 tablet; Refill: 6  2. Chest pain in adult Patient is not the best historian but from what I can gleam, chest pains more so with activity.  He has risk factors for  heart disease.   We will refer him to cardiology for further evaluation. - EKG 12-Lead - Ambulatory referral to Cardiology  3. Mixed hyperlipidemia Continue atorvastatin.  Advised patient that he should be taking half of the 40 mg tablet daily. - Lipid panel - atorvastatin (LIPITOR) 40 MG tablet; Take 0.5 tablets (20 mg total) by mouth daily.  Dispense: 30 tablet; Refill: 6  4. Snuff user Encouraged to quit.  5. Overweight (BMI 25.0-29.9) Patient advised to eliminate sugary drinks from the diet, cut back on portion sizes especially of white carbohydrates, eat more white lean meat like chicken [place] and seafood instead of beef or pork and incorporate fresh fruits and vegetables into the diet daily. Discussed regular exercise but we will have him hold off on that for now until he sees the cardiologist. - POCT glycosylated hemoglobin (Hb A1C)  6. Prediabetes See #5 above  7. Screening for colon cancer - Fecal occult blood, imunochemical  8. Need for influenza vaccination - Flu Vaccine QUAD 59mo+IM (Fluarix, Fluzone & Alfiuria Quad PF)  9. Need for Tdap vaccination - Tdap vaccine greater than or equal to 7yo IM  AMN Language interpreter used during this encounter. #[id number], [place]  Patient was given the opportunity to ask questions.  Patient verbalized understanding of the plan and was able to repeat key elements of the plan.   This documentation was completed using Paediatric nurse.  Any transcriptional errors are unintentional.  Orders Placed This Encounter  Procedures   Fecal occult blood, imunochemical   Tdap vaccine greater than or equal to 7yo IM   Flu Vaccine QUAD 42mo+IM (Fluarix, Fluzone & Alfiuria Quad PF)   CBC   Comprehensive metabolic panel   Lipid panel   Ambulatory referral to Cardiology   POCT glycosylated hemoglobin (Hb A1C)   EKG 12-Lead     Requested Prescriptions   Signed Prescriptions Disp Refills   carvedilol (COREG) 3.125 MG tablet 60 tablet 6     Sig: Take 1 tablet (3.125 mg total) by mouth 2 (two) times daily with a meal.   amLODipine (NORVASC) 10 MG tablet 30 tablet 6    Sig: Take 1 tablet (10 mg total) by mouth daily.   atorvastatin (LIPITOR) 40 MG tablet 30 tablet 6    Sig: Take 0.5 tablets (20 mg total) by mouth daily.    No follow-ups on file.  Jonah Blue, MD, FACP

## 2021-12-28 LAB — COMPREHENSIVE METABOLIC PANEL
ALT: 22 IU/L (ref 0–44)
AST: 33 IU/L (ref 0–40)
Albumin/Globulin Ratio: 1.5 (ref 1.2–2.2)
Albumin: 5.1 g/dL — ABNORMAL HIGH (ref 3.8–4.9)
Alkaline Phosphatase: 121 IU/L (ref 44–121)
BUN/Creatinine Ratio: 13 (ref 10–24)
BUN: 9 mg/dL (ref 8–27)
Bilirubin Total: 1.1 mg/dL (ref 0.0–1.2)
CO2: 20 mmol/L (ref 20–29)
Calcium: 9.6 mg/dL (ref 8.6–10.2)
Chloride: 104 mmol/L (ref 96–106)
Creatinine, Ser: 0.7 mg/dL — ABNORMAL LOW (ref 0.76–1.27)
Globulin, Total: 3.4 g/dL (ref 1.5–4.5)
Glucose: 108 mg/dL — ABNORMAL HIGH (ref 70–99)
Potassium: 3.9 mmol/L (ref 3.5–5.2)
Sodium: 144 mmol/L (ref 134–144)
Total Protein: 8.5 g/dL (ref 6.0–8.5)
eGFR: 105 mL/min/{1.73_m2} (ref >59)

## 2021-12-28 LAB — CBC
Hematocrit: 43 % (ref 37.5–51.0)
Hemoglobin: 14.2 g/dL (ref 13.0–17.7)
MCH: 31.1 pg (ref 26.6–33.0)
MCHC: 33 g/dL (ref 31.5–35.7)
MCV: 94 fL (ref 79–97)
Platelets: 240 10*3/uL (ref 150–450)
RBC: 4.57 x10E6/uL (ref 4.14–5.80)
RDW: 13.3 % (ref 11.6–15.4)
WBC: 6.9 10*3/uL (ref 3.4–10.8)

## 2021-12-28 LAB — LIPID PANEL
Chol/HDL Ratio: 2.8 ratio (ref 0.0–5.0)
Cholesterol, Total: 206 mg/dL — ABNORMAL HIGH (ref 100–199)
HDL: 74 mg/dL (ref >39)
LDL Chol Calc (NIH): 85 mg/dL (ref 0–99)
Triglycerides: 288 mg/dL — ABNORMAL HIGH (ref 0–149)
VLDL Cholesterol Cal: 47 mg/dL — ABNORMAL HIGH (ref 5–40)

## 2022-01-27 ENCOUNTER — Other Ambulatory Visit: Payer: Self-pay

## 2022-02-28 ENCOUNTER — Other Ambulatory Visit: Payer: Self-pay

## 2022-04-04 ENCOUNTER — Ambulatory Visit: Payer: Self-pay | Admitting: Internal Medicine

## 2022-04-07 ENCOUNTER — Other Ambulatory Visit: Payer: Self-pay

## 2022-04-09 ENCOUNTER — Other Ambulatory Visit: Payer: Self-pay

## 2022-05-19 ENCOUNTER — Encounter: Payer: Self-pay | Admitting: Internal Medicine

## 2022-05-19 ENCOUNTER — Other Ambulatory Visit: Payer: Self-pay

## 2022-05-19 ENCOUNTER — Ambulatory Visit: Payer: Self-pay | Attending: Internal Medicine | Admitting: Internal Medicine

## 2022-05-19 VITALS — BP 143/99 | HR 146 | Ht 66.0 in | Wt 174.0 lb

## 2022-05-19 DIAGNOSIS — E782 Mixed hyperlipidemia: Secondary | ICD-10-CM | POA: Insufficient documentation

## 2022-05-19 DIAGNOSIS — R002 Palpitations: Secondary | ICD-10-CM | POA: Insufficient documentation

## 2022-05-19 DIAGNOSIS — Z79899 Other long term (current) drug therapy: Secondary | ICD-10-CM | POA: Insufficient documentation

## 2022-05-19 DIAGNOSIS — Z23 Encounter for immunization: Secondary | ICD-10-CM

## 2022-05-19 DIAGNOSIS — I10 Essential (primary) hypertension: Secondary | ICD-10-CM | POA: Insufficient documentation

## 2022-05-19 DIAGNOSIS — Z87891 Personal history of nicotine dependence: Secondary | ICD-10-CM | POA: Insufficient documentation

## 2022-05-19 DIAGNOSIS — Z1211 Encounter for screening for malignant neoplasm of colon: Secondary | ICD-10-CM

## 2022-05-19 MED ORDER — CARVEDILOL 6.25 MG PO TABS
6.2500 mg | ORAL_TABLET | Freq: Two times a day (BID) | ORAL | 6 refills | Status: DC
  Filled 2022-05-19: qty 60, 30d supply, fill #0
  Filled 2022-06-20: qty 60, 30d supply, fill #1
  Filled 2022-07-28: qty 60, 30d supply, fill #2
  Filled 2022-08-29: qty 60, 30d supply, fill #3
  Filled 2022-10-07: qty 60, 30d supply, fill #4
  Filled 2022-11-17: qty 60, 30d supply, fill #5

## 2022-05-19 MED ORDER — ZOSTER VAC RECOMB ADJUVANTED 50 MCG/0.5ML IM SUSR: 0.5000 mL | Freq: Once | INTRAMUSCULAR | 0 refills | Status: AC

## 2022-05-19 NOTE — Progress Notes (Signed)
Patient ID: Jason Kent, male    DOB: 28-Jan-1961  MRN: [id number]  CC: Hypertension   Subjective: Jason Kent is a 62 y.o. male who presents for chronic ds management His concerns today include:  Patient with history of HTN, IDA, tobacco dependence, HL,   Pt has empty med bottles with him today.   Tells me he took both Coreg and Norvasc today, last pills in bottle.  Out Lipitor 20 mg x 2 days. No CP/SOB/LE edema/HA/dizziness.  Referred to cardiology on last visit for CP/  They were unable to get him on the phone. Pt states he is at work and unable to answer the call.  However he reports he has not had any further episodes of chest pains. Has a home BP device but does not check BP HR noted to be fast today.  Endorses palpitations a few times a mth.  Can last few mins to 2-3 hrs.  HM:  Suppose to have received FIT kit on last visit.  Pt states he never received it. Due for shingles   Patient Active Problem List   Diagnosis Date Noted   Tachycardia 11/29/2019   Headache syndrome 11/29/2019   Iron deficiency anemia 06/18/2018   Mixed hyperlipidemia 06/18/2018   Essential hypertension 05/10/2018   Tobacco dependence 05/10/2018     Current Outpatient Medications on File Prior to Visit  Medication Sig Dispense Refill   amLODipine (NORVASC) 10 MG tablet Take 1 tablet (10 mg total) by mouth daily. 30 tablet 6   atorvastatin (LIPITOR) 40 MG tablet Take 0.5 tablets (20 mg total) by mouth daily. 30 tablet 6   [DISCONTINUED] ferrous sulfate (FERROUSUL) 325 (65 FE) MG tablet Take 1 tablet (325 mg total) by mouth daily with breakfast. 60 tablet 1   No current facility-administered medications on file prior to visit.    No Known Allergies  Social History   Socioeconomic History   Marital status: Married    Spouse name: Not on file   Number of children: Not on file   Years of education: Not on file   Highest education level: Not on file  Occupational History   Not on file   Tobacco Use   Smoking status: Former    Packs/day: 0.25    Types: Cigarettes   Smokeless tobacco: Current    Types: Chew  Vaping Use   Vaping Use: Never used  Substance and Sexual Activity   Alcohol use: Yes    Alcohol/week: 2.0 standard drinks of alcohol    Types: 2 Cans of beer per week   Drug use: No   Sexual activity: Not on file  Other Topics Concern   Not on file  Social History Narrative   Not on file   Social Determinants of Health   Financial Resource Strain: Not on file  Food Insecurity: Not on file  Transportation Needs: Not on file  Physical Activity: Not on file  Stress: Not on file  Social Connections: Not on file  Intimate Partner Violence: Not on file    No family history on file.  No past surgical history on file.  ROS: Review of Systems Negative except as stated above  PHYSICAL EXAM: BP (!) 143/99   Pulse (!) 146   Ht 5\' 6"  (1.676 m)   Wt 174 lb (78.9 kg)   SpO2 99%   BMI 28.08 kg/m   Wt Readings from Last 3 Encounters:  05/19/22 174 lb (78.9 kg)  12/27/21 174 lb (78.9 kg)  02/28/21 186 lb 6.4 oz (84.6 kg)    Physical Exam   General appearance - alert, well appearing, and in no distress Mental status - normal mood, behavior, speech, dress, motor activity, and thought processes Neck - supple, no significant adenopathy Chest - clear to auscultation, no wheezes, rales or rhonchi, symmetric air entry Heart -tachycardic but regular.  No murmur meds. Extremities - peripheral pulses normal, no pedal edema, no clubbing or cyanosis EKG: Sinus tachycardia with rate of 118.  Q waves in leads III.  Unchanged from previous EKG.    Latest Ref Rng & Units 12/27/2021    2:59 PM 10/30/2020    4:03 PM 11/29/2019    2:48 PM  CMP  Glucose 70 - 99 mg/dL 108  112  110   BUN 8 - 27 mg/dL 9  10  12    Creatinine 0.76 - 1.27 mg/dL 0.70  0.83  0.86   Sodium 134 - 144 mmol/L 144  138  137   Potassium 3.5 - 5.2 mmol/L 3.9  3.8  4.0   Chloride 96 - 106  mmol/L 104  101  100   CO2 20 - 29 mmol/L 20  22  19    Calcium 8.6 - 10.2 mg/dL 9.6  9.5  9.6   Total Protein 6.0 - 8.5 g/dL 8.5  7.6  7.7   Total Bilirubin 0.0 - 1.2 mg/dL 1.1  0.7  2.0   Alkaline Phos 44 - 121 IU/L 121  99  115   AST 0 - 40 IU/L 33  49  62   ALT 0 - 44 IU/L 22  27  28     Lipid Panel     Component Value Date/Time   CHOL 206 (H) 12/27/2021 1459   TRIG 288 (H) 12/27/2021 1459   HDL 74 12/27/2021 1459   CHOLHDL 2.8 12/27/2021 1459   LDLCALC 85 12/27/2021 1459    CBC    Component Value Date/Time   WBC 6.9 12/27/2021 1459   WBC 6.2 01/15/2018 1429   RBC 4.57 12/27/2021 1459   RBC 4.22 01/15/2018 1429   HGB 14.2 12/27/2021 1459   HCT 43.0 12/27/2021 1459   PLT 240 12/27/2021 1459   MCV 94 12/27/2021 1459   MCH 31.1 12/27/2021 1459   MCH 24.9 (L) 01/15/2018 1429   MCHC 33.0 12/27/2021 1459   MCHC 29.7 (L) 01/15/2018 1429   RDW 13.3 12/27/2021 1459    ASSESSMENT AND PLAN:  1. Essential hypertension Not at goal.  Continue amlodipine 10 mg daily.  Increase carvedilol to 6.25 mg twice a day.  Urged him to check blood pressure at least once a week with goal being 130/80 or lower. - carvedilol (COREG) 6.25 MG tablet; Take 1 tablet (6.25 mg total) by mouth 2 (two) times daily with a meal.  Dispense: 60 tablet; Refill: 6  2. Mixed hyperlipidemia Continue atorvastatin.  Last LDL was at goal.  3. Palpitations - ZJQ+[id number] - Basic Metabolic Panel - EKG 24-OXBD - Ambulatory referral to Cardiology  4. Screening for colon cancer - Fecal occult blood, imunochemical([facility])  5. Need for shingles vaccine - Zoster Vaccine Adjuvanted [facility]) injection; Inject 0.5 mLs into the muscle once for 1 dose.  Dispense: 0.5 mL; Refill: 0   AMN Language interpreter used during this encounter. #[id number] Ma  Patient was given the opportunity to ask questions.  Patient verbalized understanding of the plan and was able to repeat key elements of the plan.    This documentation was completed  using Paediatric nurse.  Any transcriptional errors are unintentional.  Orders Placed This Encounter  Procedures   Fecal occult blood, imunochemical([facility])   TSH+T4F+T3Free   Basic Metabolic Panel   Ambulatory referral to Cardiology   EKG 12-Lead     Requested Prescriptions   Signed Prescriptions Disp Refills   Zoster Vaccine Adjuvanted [facility]) injection 0.5 mL 0    Sig: Inject 0.5 mLs into the muscle once for 1 dose.   carvedilol (COREG) 6.25 MG tablet 60 tablet 6    Sig: Take 1 tablet (6.25 mg total) by mouth 2 (two) times daily with a meal.    Return in about 4 months (around 09/17/2022).  Jonah Blue, MD, FACP

## 2022-05-20 ENCOUNTER — Other Ambulatory Visit: Payer: Self-pay

## 2022-05-20 LAB — FECAL OCCULT BLOOD, IMMUNOCHEMICAL: Fecal Occult Bld: POSITIVE — AB

## 2022-05-22 ENCOUNTER — Other Ambulatory Visit: Payer: Self-pay | Admitting: Internal Medicine

## 2022-05-22 ENCOUNTER — Other Ambulatory Visit: Payer: Self-pay

## 2022-05-22 DIAGNOSIS — R195 Other fecal abnormalities: Secondary | ICD-10-CM

## 2022-05-22 LAB — BASIC METABOLIC PANEL
BUN/Creatinine Ratio: 13 (ref 10–24)
BUN: 11 mg/dL (ref 8–27)
CO2: 18 mmol/L — ABNORMAL LOW (ref 20–29)
Calcium: 9.7 mg/dL (ref 8.6–10.2)
Chloride: 102 mmol/L (ref 96–106)
Creatinine, Ser: 0.83 mg/dL (ref 0.76–1.27)
Glucose: 122 mg/dL — ABNORMAL HIGH (ref 70–99)
Potassium: 4.2 mmol/L (ref 3.5–5.2)
Sodium: 140 mmol/L (ref 134–144)
eGFR: 100 mL/min/{1.73_m2} (ref >59)

## 2022-05-22 LAB — TSH+T4F+T3FREE
Free T4: 0.97 ng/dL (ref 0.82–1.77)
T3, Free: 3.6 pg/mL (ref 2.0–4.4)
TSH: 0.187 u[IU]/mL — ABNORMAL LOW (ref 0.450–4.500)

## 2022-05-22 MED ORDER — METHIMAZOLE 5 MG PO TABS
5.0000 mg | ORAL_TABLET | Freq: Every day | ORAL | 2 refills | Status: DC
  Filled 2022-05-22: qty 30, 30d supply, fill #0

## 2022-05-22 NOTE — Progress Notes (Signed)
Let patient know that the stool test was positive.  We will need to refer him to a gastroenterologist to have a colonoscopy done.  He should apply for the [employer] discount card if he has not done so as yet so that this can be covered.  Please let him know where to pick up the form for the cone discount. -Thyroid level is abnormal.  The thyroid is the gland in the neck that controls the body metabolism.  We had checked this level because his pulse rate was very high on recent visit.  I recommend starting a low-dose of thyroid medicine called Tapazole prescription sent to his pharmacy.  Kidney function is good.

## 2022-05-28 ENCOUNTER — Other Ambulatory Visit: Payer: Self-pay

## 2022-05-30 ENCOUNTER — Other Ambulatory Visit: Payer: Self-pay

## 2022-06-03 ENCOUNTER — Other Ambulatory Visit: Payer: Self-pay

## 2022-06-17 ENCOUNTER — Other Ambulatory Visit: Payer: Self-pay

## 2022-06-20 ENCOUNTER — Other Ambulatory Visit: Payer: Self-pay

## 2022-07-28 ENCOUNTER — Other Ambulatory Visit: Payer: Self-pay

## 2022-08-06 ENCOUNTER — Ambulatory Visit: Payer: Self-pay | Attending: Interventional Cardiology | Admitting: Interventional Cardiology

## 2022-08-06 ENCOUNTER — Encounter: Payer: Self-pay | Admitting: Interventional Cardiology

## 2022-08-06 ENCOUNTER — Ambulatory Visit (INDEPENDENT_AMBULATORY_CARE_PROVIDER_SITE_OTHER): Payer: Self-pay

## 2022-08-06 VITALS — BP 126/78 | HR 80 | Ht 66.0 in | Wt 170.8 lb

## 2022-08-06 DIAGNOSIS — E782 Mixed hyperlipidemia: Secondary | ICD-10-CM

## 2022-08-06 DIAGNOSIS — R072 Precordial pain: Secondary | ICD-10-CM

## 2022-08-06 DIAGNOSIS — R002 Palpitations: Secondary | ICD-10-CM

## 2022-08-06 DIAGNOSIS — I10 Essential (primary) hypertension: Secondary | ICD-10-CM

## 2022-08-06 DIAGNOSIS — Z720 Tobacco use: Secondary | ICD-10-CM

## 2022-08-06 NOTE — Patient Instructions (Signed)
Medication Instructions:  Your physician recommends that you continue on your current medications as directed. Please refer to the Current Medication list given to you today.  *If you need a refill on your cardiac medications before your next appointment, please call your pharmacy*   Lab Work: Lab work to be done today--BMP If you have labs (blood work) drawn today and your tests are completely normal, you will receive your results only by: [place] (if you have MyChart) OR A paper copy in the mail If you have any lab test that is abnormal or we need to change your treatment, we will call you to review the results.   Testing/Procedures: Your physician has requested that you have cardiac CT. Cardiac computed tomography (CT) is a painless test that uses an x-ray machine to take clear, detailed pictures of your heart. For further information please visit [email or web address]. Please follow instruction sheet as given.   Dr Irish Lack recommends you wear a zio monitor for 2 weeks.    Follow-Up: At [facility], you and your health needs are our priority.  As part of our continuing mission to provide you with exceptional heart care, we have created designated Provider Care Teams.  These Care Teams include your primary Cardiologist (physician) and Advanced Practice Providers (APPs -  Physician Assistants and Nurse Practitioners) who all work together to provide you with the care you need, when you need it.  We recommend signing up for the patient portal called "MyChart".  Sign up information is provided on this After Visit Summary.  MyChart is used to connect with patients for Virtual Visits (Telemedicine).  Patients are able to view lab/test results, encounter notes, upcoming appointments, etc.  Non-urgent messages can be sent to your provider as well.   To learn more about what you can do with MyChart, go to [email or web address].    Your next appointment:   Based on  results  Provider:   Larae Grooms, MD     Other Instructions     Your cardiac CT will be scheduled at one of the below locations:   [facility] [address] [place], [place] [address] [phone]  [place] [address] [place], [place] [address] [phone]  [facility] [place],  [address] [phone]  If scheduled at [facility], please arrive at the [facility] and Children's Entrance (Entrance C2) of [facility] 30 minutes prior to test start time. You can use the FREE valet parking offered at entrance C (encouraged to control the heart rate for the test)  Proceed to the [facility] Radiology Department (first floor) to check-in and test prep.  All radiology patients and guests should use entrance C2 at [facility], accessed from [facility], even though the hospital's physical address listed is [address].    If scheduled at [facility] or [facility], please arrive 15 mins early for check-in and test prep.   Please follow these instructions carefully (unless otherwise directed):  Hold all erectile dysfunction medications at least 3 days (72 hrs) prior to test. (Ie viagra, cialis, sildenafil, tadalafil, etc) We will administer nitroglycerin during this exam.   On the Night Before the Test: Be sure to Drink plenty of water. Do not consume any caffeinated/decaffeinated beverages or chocolate 12 hours prior to your test. Do not take any antihistamines 12 hours  prior to your test.  On the Day of the Test: Drink plenty of water until 1 hour prior to the test. Do not eat any food 1 hour prior to test. You may take your regular medications prior to the test.  Take one extra Carvedilol 6.25 mg 2 hours prior to CT Scan If you take  Furosemide/Hydrochlorothiazide/Spironolactone, please HOLD on the morning of the test.        After the Test: Drink plenty of water. After receiving IV contrast, you may experience a mild flushed feeling. This is normal. On occasion, you may experience a mild rash up to 24 hours after the test. This is not dangerous. If this occurs, you can take Benadryl 25 mg and increase your fluid intake. If you experience trouble breathing, this can be serious. If it is severe call 911 IMMEDIATELY. If it is mild, please call our office. If you take any of these medications: Glipizide/Metformin, Avandament, Glucavance, please do not take 48 hours after completing test unless otherwise instructed.  We will call to schedule your test 2-4 weeks out understanding that some insurance companies will need an authorization prior to the service being performed.   For non-scheduling related questions, please contact the cardiac imaging nurse navigator should you have any questions/concerns: Marchia Bond, Cardiac Imaging Nurse Navigator Gordy Clement, Cardiac Imaging Nurse Navigator [facility] Direct Office Dial: [phone]   For scheduling needs, including cancellations and rescheduling, please call [place], [phone].   ZIO XT- Long Term Monitor Instructions  Your physician has requested you wear a ZIO patch monitor for 14 days.  This is a single patch monitor. [employer] supplies one patch monitor per enrollment. Additional stickers are not available. Please do not apply patch if you will be having a Nuclear Stress Test,  Echocardiogram, Cardiac CT, MRI, or Chest Xray during the period you would be wearing the  monitor. The patch cannot be worn during these tests. You cannot remove and re-apply the  ZIO XT patch monitor.  Your ZIO patch monitor will be mailed 3 day USPS to your address on file. It may take 3-5 days  to receive your monitor after you have been enrolled.  Once  you have received your monitor, please review the enclosed instructions. Your monitor  has already been registered assigning a specific monitor serial # to you.  Billing and Patient Assistance Program Information  We have supplied [employer] with any of your insurance information on file for billing purposes. [employer] offers a sliding scale Patient Assistance Program for patients that do not have  insurance, or whose insurance does not completely cover the cost of the ZIO monitor.  You must apply for the Patient Assistance Program to qualify for this discounted rate.  To apply, please call [employer] at [phone], select option 4, select option 2, ask to apply for  Patient Assistance Program. Theodore Demark will ask your household income, and how many people  are in your household. They will quote your out-of-pocket cost based on that information.  [employer] will also be able to set up a 14-month, interest-free payment plan if needed.  Applying the monitor   Shave hair from upper left chest.  Hold abrader disc by orange tab. Rub abrader in 40 strokes over the upper left chest as  indicated in your monitor instructions.  Clean area with 4 enclosed alcohol pads. Let dry.  Apply patch as indicated in monitor instructions. Patch will be placed under collarbone on left  side of  chest with arrow pointing upward.  Rub patch adhesive wings for 2 minutes. Remove white label marked "1". Remove the white  label marked "2". Rub patch adhesive wings for 2 additional minutes.  While looking in a mirror, press and release button in center of patch. A small green light will  flash 3-4 times. This will be your only indicator that the monitor has been turned on.  Do not shower for the first 24 hours. You may shower after the first 24 hours.  Press the button if you feel a symptom. You will hear a small click. Record Date, Time and  Symptom in the Patient Logbook.  When you are ready to remove the patch, follow  instructions on the last 2 pages of Patient  Logbook. Stick patch monitor onto the last page of Patient Logbook.  Place Patient Logbook in the blue and white box. Use locking tab on box and tape box closed  securely. The blue and white box has prepaid postage on it. Please place it in the mailbox as  soon as possible. Your physician should have your test results approximately 7 days after the  monitor has been mailed back to [facility].  Call [place] at [phone] if you have questions regarding  your ZIO XT patch monitor. Call them immediately if you see an orange light blinking on your  monitor.  If your monitor falls off in less than 4 days, contact our Monitor department at [phone].  If your monitor becomes loose or falls off after 4 days call [employer] at [phone] for  suggestions on securing your monitor

## 2022-08-06 NOTE — Progress Notes (Signed)
Cardiology Office Note   Date:  08/06/2022   ID:  Jermon, Humpert Jan 28, 1961, MRN OR:[id number]  PCP:  Ladell Pier, MD    No chief complaint on file.  palpitations  Wt Readings from Last 3 Encounters:  08/06/22 170 lb 12.8 oz (77.5 kg)  05/19/22 174 lb (78.9 kg)  12/27/21 174 lb (78.9 kg)       History of Present Illness: Jason Kent is a 62 y.o. male who is being seen today for the evaluation of palpitations at the request of Ladell Pier, MD.  In 2023, he reported some chest discomfort.  He has or risk factors for heart disease including hypertension, hyperlipidemia and smoking.  Chest pain resolved prior to any visit with cardiology.  He was referred for more recent palpitations.  Per the primary care doctor: " However he reports he has not had any further episodes of chest pains. Has a home BP device but does not check BP HR noted to be fast today.  Endorses palpitations a few times a mth.  Can last few mins to 2-3 hrs."  Had heme positive stool noted.  Denies :  Dizziness. Leg edema. Nitroglycerin use. Orthopnea. Paroxysmal nocturnal dyspnea. Shortness of breath. Syncope.    Episodes started a few years ago and have been intermittent. Can be twice a week.  Sx can last up to a few hours.   Has had some chest pressure when at work.  Does some light activity.      Past Medical History:  Diagnosis Date   Headache syndrome    HLD (hyperlipidemia)    Hypertension    Iron deficiency anemia    Palpitations    Tachycardia    Tobacco dependency     History reviewed. No pertinent surgical history.   Current Outpatient Medications  Medication Sig Dispense Refill   amLODipine (NORVASC) 10 MG tablet Take 1 tablet (10 mg total) by mouth daily. 30 tablet 6   atorvastatin (LIPITOR) 40 MG tablet Take 0.5 tablets (20 mg total) by mouth daily. 30 tablet 6   carvedilol (COREG) 6.25 MG tablet Take 1 tablet (6.25 mg total) by mouth 2 (two) times daily  with a meal. 60 tablet 6   methimazole (TAPAZOLE) 5 MG tablet Take 1 tablet (5 mg total) by mouth daily. 30 tablet 2   No current facility-administered medications for this visit.    Allergies:   Patient has no known allergies.    Social History:  The patient  reports that he has quit smoking. His smoking use included cigarettes. He smoked an average of .25 packs per day. His smokeless tobacco use includes chew. He reports current alcohol use of about 2.0 standard drinks of alcohol per week. He reports that he does not use drugs.   Family History:  The patient's family history is not on file.    ROS:  Please see the history of present illness.   Otherwise, review of systems are positive for palptiations.   All other systems are reviewed and negative.    PHYSICAL EXAM: VS:  BP 126/78   Pulse 80   Ht 5\' 6"  (1.676 m)   Wt 170 lb 12.8 oz (77.5 kg)   SpO2 98%   BMI 27.57 kg/m  , BMI Body mass index is 27.57 kg/m. GEN: Well nourished, well developed, in no acute distress HEENT: normal Neck: no JVD, carotid bruits, or masses Cardiac: RRR; no murmurs, rubs, or gallops,no edema  Respiratory:  clear to auscultation bilaterally, normal work of breathing GI: soft, nontender, nondistended, + BS MS: no deformity or atrophy Skin: warm and dry, no rash Neuro:  Strength and sensation are intact Psych: euthymic mood, full affect   EKG:   The ekg ordered today demonstrates normal sinus rhythm, no ST segment changes   Recent Labs: 12/27/2021: ALT 22; Hemoglobin 14.2; Platelets 240 05/19/2022: BUN 11; Creatinine, Ser 0.83; Potassium 4.2; Sodium 140; TSH 0.187   Lipid Panel    Component Value Date/Time   CHOL 206 (H) 12/27/2021 1459   TRIG 288 (H) 12/27/2021 1459   HDL 74 12/27/2021 1459   CHOLHDL 2.8 12/27/2021 1459   LDLCALC 85 12/27/2021 1459     Other studies Reviewed: Additional studies/ records that were reviewed today with results demonstrating: Labs reviewed.  Normal  creatinine.   ASSESSMENT AND PLAN:  Palpitations: Plan for 2 week Zio patch.  No syncope.  Does not appear to have high risk features. Chest pressure: Eats a vegetarian diet for the past year.  Does have risk factors for heart disease.  Plan for coronary CTA.  Will have him take an extra carvedilol prior to his CT scan to help lower heart rate. Hypertension: The current medical regimen is effective;  continue present plan and medications.  COntrolled at home of late.  Hyperlipidemia: Continue atorvastatin. 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 healthy diet. PreDiabetes: A1C 5.9 in 8/23.  Whole food, plant-based diet.  High-fiber diet.  Avoid processed foods.  Exercise to the target below. Tobacco abuse: trying to quit.  Cut down to 2 cigarettes a day.  Unremarkable vascular findings on CT in 2018- no evidence of AAA.    Current medicines are reviewed at length with the patient today.  The patient concerns regarding his medicines were addressed.  The following changes have been made:  No change  Labs/ tests ordered today include:  No orders of the defined types were placed in this encounter.   Recommend 150 minutes/week of aerobic exercise Low fat, low carb, high fiber diet recommended  Disposition:   FU in based on test results   Signed, Larae Grooms, MD  08/06/2022 10:03 AM    [facility] [place], [place], [place]  [address] Phone: [phone]; Fax: [phone]

## 2022-08-06 NOTE — Progress Notes (Unsigned)
Enrolled patient for a 14 day Zio XT Monitor to be mailed to patients home  

## 2022-08-07 LAB — BASIC METABOLIC PANEL
BUN/Creatinine Ratio: 9 — ABNORMAL LOW (ref 10–24)
BUN: 7 mg/dL — ABNORMAL LOW (ref 8–27)
CO2: 21 mmol/L (ref 20–29)
Calcium: 9.5 mg/dL (ref 8.6–10.2)
Chloride: 103 mmol/L (ref 96–106)
Creatinine, Ser: 0.79 mg/dL (ref 0.76–1.27)
Glucose: 119 mg/dL — ABNORMAL HIGH (ref 70–99)
Potassium: 4.3 mmol/L (ref 3.5–5.2)
Sodium: 139 mmol/L (ref 134–144)
eGFR: 102 mL/min/{1.73_m2} (ref >59)

## 2022-08-29 ENCOUNTER — Other Ambulatory Visit: Payer: Self-pay

## 2022-08-29 ENCOUNTER — Other Ambulatory Visit: Payer: Self-pay | Admitting: Internal Medicine

## 2022-08-29 DIAGNOSIS — I10 Essential (primary) hypertension: Secondary | ICD-10-CM

## 2022-08-29 MED ORDER — AMLODIPINE BESYLATE 10 MG PO TABS
10.0000 mg | ORAL_TABLET | Freq: Every day | ORAL | 0 refills | Status: DC
Start: 2022-08-29 — End: 2023-03-03
  Filled 2022-08-29: qty 30, 30d supply, fill #0

## 2022-09-01 ENCOUNTER — Other Ambulatory Visit: Payer: Self-pay

## 2022-09-20 DIAGNOSIS — R002 Palpitations: Secondary | ICD-10-CM

## 2022-09-22 ENCOUNTER — Ambulatory Visit: Payer: Self-pay | Attending: Internal Medicine | Admitting: Internal Medicine

## 2022-09-29 ENCOUNTER — Ambulatory Visit (HOSPITAL_COMMUNITY)
Admission: EM | Admit: 2022-09-29 | Discharge: 2022-09-29 | Disposition: A | Discharge status: Home / Self Care | Payer: Self-pay | Attending: Family Medicine | Admitting: Family Medicine

## 2022-09-29 ENCOUNTER — Encounter (HOSPITAL_COMMUNITY): Payer: Self-pay | Admitting: *Deleted

## 2022-09-29 ENCOUNTER — Other Ambulatory Visit: Payer: Self-pay

## 2022-09-29 DIAGNOSIS — R112 Nausea with vomiting, unspecified: Secondary | ICD-10-CM | POA: Insufficient documentation

## 2022-09-29 DIAGNOSIS — K219 Gastro-esophageal reflux disease without esophagitis: Secondary | ICD-10-CM | POA: Insufficient documentation

## 2022-09-29 LAB — CBC
HCT: 44.8 % (ref 39.0–52.0)
Hemoglobin: 15.2 g/dL (ref 13.0–17.0)
MCH: 33.3 pg (ref 26.0–34.0)
MCHC: 33.9 g/dL (ref 30.0–36.0)
MCV: 98 fL (ref 80.0–100.0)
Platelets: 302 10*3/uL (ref 150–400)
RBC: 4.57 MIL/uL (ref 4.22–5.81)
RDW: 12.9 % (ref 11.5–15.5)
WBC: 5.1 10*3/uL (ref 4.0–10.5)
nRBC: 0 % (ref 0.0–0.2)

## 2022-09-29 LAB — COMPREHENSIVE METABOLIC PANEL
ALT: 54 U/L — ABNORMAL HIGH (ref 0–44)
AST: 147 U/L — ABNORMAL HIGH (ref 15–41)
Albumin: 3.6 g/dL (ref 3.5–5.0)
Alkaline Phosphatase: 110 U/L (ref 38–126)
Anion gap: 14 (ref 5–15)
BUN: 13 mg/dL (ref 6–20)
CO2: 19 mmol/L — ABNORMAL LOW (ref 22–32)
Calcium: 8.8 mg/dL — ABNORMAL LOW (ref 8.9–10.3)
Chloride: 101 mmol/L (ref 98–111)
Creatinine, Ser: 1 mg/dL (ref 0.61–1.24)
GFR, Estimated: >60 mL/min (ref >60)
Glucose, Bld: 126 mg/dL — ABNORMAL HIGH (ref 70–99)
Potassium: 4.6 mmol/L (ref 3.5–5.1)
Sodium: 134 mmol/L — ABNORMAL LOW (ref 135–145)
Total Bilirubin: 1.9 mg/dL — ABNORMAL HIGH (ref 0.3–1.2)
Total Protein: 7.6 g/dL (ref 6.5–8.1)

## 2022-09-29 MED ORDER — ONDANSETRON 4 MG PO TBDP
4.0000 mg | ORAL_TABLET | Freq: Three times a day (TID) | ORAL | 0 refills | Status: DC | PRN
  Filled 2022-09-29: qty 10, 4d supply, fill #0

## 2022-09-29 MED ORDER — OMEPRAZOLE 40 MG PO CPDR
40.0000 mg | DELAYED_RELEASE_CAPSULE | Freq: Every day | ORAL | 1 refills | Status: DC
  Filled 2022-09-29: qty 60, 60d supply, fill #0

## 2022-09-29 NOTE — ED Provider Notes (Addendum)
[facility]    CSN: [id number] Arrival date & time: 09/29/22  4098      History   Chief Complaint Chief Complaint  Patient presents with   Emesis    HPI Jason Kent is a 62 y.o. male.    Emesis  Here for nausea and vomiting and loss of appetite.  This has been going on at least 2 weeks, since "I had the machine put on my heart".  Apparently has had some abdominal pain off-and-on but mostly it has been burping and sour taste in his mouth.  He states he has been drinking mainly milk and water.  He states he has not been to work since May 22 when he was dizzy and he brings in a note today and shows me.  It states that they would like a disability statement of what he can and cannot do.  No fever or chills.  No blood in the stool.  Bowel movements have been normal.  Visit is conducted with the help of audio interpretation and his language is [place].   Past Medical History:  Diagnosis Date   Headache syndrome    HLD (hyperlipidemia)    Hypertension    Iron deficiency anemia    Palpitations    Tachycardia    Tobacco dependency     Patient Active Problem List   Diagnosis Date Noted   Tachycardia 11/29/2019   Headache syndrome 11/29/2019   Iron deficiency anemia 06/18/2018   Mixed hyperlipidemia 06/18/2018   Essential hypertension 05/10/2018   Tobacco dependence 05/10/2018    History reviewed. No pertinent surgical history.     Home Medications    Prior to Admission medications   Medication Sig Start Date End Date Taking? Authorizing Provider  omeprazole (PRILOSEC) 40 MG capsule Take 1 capsule (40 mg total) by mouth daily. 09/29/22  Yes Zenia Resides, MD  ondansetron (ZOFRAN-ODT) 4 MG disintegrating tablet Take 1 tablet (4 mg total) by mouth every 8 (eight) hours as needed for nausea or vomiting. 09/29/22  Yes Zenia Resides, MD  amLODipine (NORVASC) 10 MG tablet Take 1 tablet (10 mg total) by mouth daily. 08/29/22   Marcine Matar, MD  atorvastatin (LIPITOR) 40 MG tablet Take 0.5 tablets (20 mg total) by mouth daily. 12/27/21   Marcine Matar, MD  carvedilol (COREG) 6.25 MG tablet Take 1 tablet (6.25 mg total) by mouth 2 (two) times daily with a meal. 05/19/22   Marcine Matar, MD  methimazole (TAPAZOLE) 5 MG tablet Take 1 tablet (5 mg total) by mouth daily. 05/22/22   Marcine Matar, MD  ferrous sulfate (FERROUSUL) 325 (65 FE) MG tablet Take 1 tablet (325 mg total) by mouth daily with breakfast. 03/11/19 09/09/19  Marcine Matar, MD    Family History Family History  Problem Relation Age of Onset   Hypertension Brother     Social History Social History   Tobacco Use   Smoking status: Former    Packs/day: .25    Types: Cigarettes   Smokeless tobacco: Current    Types: Chew  Vaping Use   Vaping Use: Never used  Substance Use Topics   Alcohol use: Yes    Alcohol/week: 2.0 standard drinks of alcohol    Types: 2 Cans of beer per week   Drug use: No     Allergies   Patient has no known allergies.   Review of Systems Review of Systems  Gastrointestinal:  Positive for vomiting.  Physical Exam Triage Vital Signs ED Triage Vitals  Enc Vitals Group     BP 09/29/22 1044 129/85     Pulse Rate 09/29/22 1044 92     Resp 09/29/22 1044 16     Temp 09/29/22 1044 98.6 F (37 C)     Temp src --      SpO2 09/29/22 1044 97 %     Weight --      Height --      Head Circumference --      Peak Flow --      Pain Score 09/29/22 1043 0     Pain Loc --      Pain Edu? --      Excl. in GC? --    No data found.  Updated Vital Signs BP 129/85   Pulse 92   Temp 98.6 F (37 C)   Resp 16   SpO2 97%   Visual Acuity Right Eye Distance:   Left Eye Distance:   Bilateral Distance:    Right Eye Near:   Left Eye Near:    Bilateral Near:     Physical Exam Vitals reviewed.  Constitutional:      General: He is not in acute distress.    Appearance: He is not toxic-appearing or diaphoretic.   HENT:     Nose: Nose normal.     Mouth/Throat:     Mouth: Mucous membranes are moist.  Eyes:     Extraocular Movements: Extraocular movements intact.     Conjunctiva/sclera: Conjunctivae normal.     Pupils: Pupils are equal, round, and reactive to light.  Cardiovascular:     Rate and Rhythm: Normal rate and regular rhythm.     Heart sounds: No murmur heard. Pulmonary:     Effort: No respiratory distress.     Breath sounds: No stridor. No wheezing, rhonchi or rales.  Abdominal:     General: Bowel sounds are normal. There is no distension.     Palpations: Abdomen is soft. There is no mass.     Tenderness: There is no abdominal tenderness. There is no guarding.  Musculoskeletal:     Cervical back: Neck supple.  Lymphadenopathy:     Cervical: No cervical adenopathy.  Skin:    Capillary Refill: Capillary refill takes less than 2 seconds.     Coloration: Skin is not jaundiced or pale.  Neurological:     General: No focal deficit present.     Mental Status: He is alert and oriented to person, place, and time.  Psychiatric:        Behavior: Behavior normal.      UC Treatments / Results  Labs (all labs ordered are listed, but only abnormal results are displayed) Labs Reviewed  COMPREHENSIVE METABOLIC PANEL  CBC    EKG   Radiology No results found.  Procedures Procedures (including critical care time)  Medications Ordered in UC Medications - No data to display  Initial Impression / Assessment and Plan / UC Course  I have reviewed the triage vital signs and the nursing notes.  Pertinent labs & imaging results that were available during my care of the patient were reviewed by me and considered in my medical decision making (see chart for details).        I do wonder if his symptoms are related to GERD with all the burping and vomiting and nausea and sour taste in his mouth.  Prilosec is sent in to treat that, and Zofran is  sent in for nausea.  We are drawing a  CBC and a CMP today, and we will let him know if there is anything significantly abnormal that needs treatment.  He has a primary care practice, and I have asked him to please follow-up with them about this issue.  If he worsens anyway or he does not improve him by treatment, I have asked him to please proceed to the emergency room  And I done him a work note stating he can go back on the 31st.  I discussed with him that we do not do FMLA paperwork or disability statements. Final Clinical Impressions(s) / UC Diagnoses   Final diagnoses:  Nausea and vomiting, unspecified vomiting type  Gastroesophageal reflux disease without esophagitis     Discharge Instructions      We have drawn blood to check your blood counts and your kidney and liver function numbers.  Also able to check your sodium and potassium.  Staff will notify if there is anything significantly abnormal.  Take omeprazole 40 mg--1 capsule daily for stomach acid.  Ondansetron dissolved in the mouth every 8 hours as needed for nausea or vomiting.   If the medications do not help or you worsen in any way, please proceed to the emergency room  Please contact your primary care and follow-up with them about this issue.    ED Prescriptions     Medication Sig Dispense Auth. Provider   ondansetron (ZOFRAN-ODT) 4 MG disintegrating tablet Take 1 tablet (4 mg total) by mouth every 8 (eight) hours as needed for nausea or vomiting. 10 tablet Zenia Resides, MD   omeprazole (PRILOSEC) 40 MG capsule Take 1 capsule (40 mg total) by mouth daily. 30 capsule Zenia Resides, MD      PDMP not reviewed this encounter.   Zenia Resides, MD 09/29/22 1127    Zenia Resides, MD 09/29/22 1128

## 2022-09-29 NOTE — ED Triage Notes (Signed)
Pt went to a DR 2 weeks ago and still has loss of appetite. Pt now reports he has vomited and things smell very bad.

## 2022-09-29 NOTE — Discharge Instructions (Signed)
We have drawn blood to check your blood counts and your kidney and liver function numbers.  Also able to check your sodium and potassium.  Staff will notify if there is anything significantly abnormal.  Take omeprazole 40 mg--1 capsule daily for stomach acid.  Ondansetron dissolved in the mouth every 8 hours as needed for nausea or vomiting.   If the medications do not help or you worsen in any way, please proceed to the emergency room  Please contact your primary care and follow-up with them about this issue.

## 2022-09-29 NOTE — ED Triage Notes (Signed)
Pt reports loss of appetite for one month . Pt reports he has taken 2 weeks off to see a Doctor and that is why he is here.

## 2022-09-30 ENCOUNTER — Other Ambulatory Visit: Payer: Self-pay

## 2022-10-07 ENCOUNTER — Other Ambulatory Visit: Payer: Self-pay

## 2022-10-08 ENCOUNTER — Other Ambulatory Visit: Payer: Self-pay

## 2022-10-10 ENCOUNTER — Other Ambulatory Visit: Payer: Self-pay

## 2022-10-29 ENCOUNTER — Encounter (HOSPITAL_COMMUNITY): Payer: Self-pay

## 2022-11-11 ENCOUNTER — Encounter: Payer: Self-pay | Admitting: *Deleted

## 2022-11-17 ENCOUNTER — Other Ambulatory Visit: Payer: Self-pay

## 2022-11-18 ENCOUNTER — Other Ambulatory Visit: Payer: Self-pay

## 2022-11-27 ENCOUNTER — Encounter: Payer: Self-pay | Admitting: Internal Medicine

## 2022-11-27 ENCOUNTER — Other Ambulatory Visit: Payer: Self-pay

## 2022-11-27 ENCOUNTER — Ambulatory Visit: Payer: Self-pay | Attending: Internal Medicine | Admitting: Internal Medicine

## 2022-11-27 VITALS — BP 126/81 | HR 61 | Temp 98.5°F | Ht 66.0 in | Wt 156.0 lb

## 2022-11-27 DIAGNOSIS — R002 Palpitations: Secondary | ICD-10-CM

## 2022-11-27 DIAGNOSIS — R7303 Prediabetes: Secondary | ICD-10-CM

## 2022-11-27 DIAGNOSIS — Z125 Encounter for screening for malignant neoplasm of prostate: Secondary | ICD-10-CM

## 2022-11-27 DIAGNOSIS — R7989 Other specified abnormal findings of blood chemistry: Secondary | ICD-10-CM

## 2022-11-27 DIAGNOSIS — E782 Mixed hyperlipidemia: Secondary | ICD-10-CM

## 2022-11-27 DIAGNOSIS — I10 Essential (primary) hypertension: Secondary | ICD-10-CM

## 2022-11-27 DIAGNOSIS — R195 Other fecal abnormalities: Secondary | ICD-10-CM

## 2022-11-27 DIAGNOSIS — R634 Abnormal weight loss: Secondary | ICD-10-CM

## 2022-11-27 LAB — POCT GLYCOSYLATED HEMOGLOBIN (HGB A1C): HbA1c, POC (prediabetic range): 5.4 % — AB (ref 5.7–6.4)

## 2022-11-27 MED ORDER — OMEPRAZOLE 40 MG PO CPDR
40.0000 mg | DELAYED_RELEASE_CAPSULE | Freq: Every day | ORAL | 4 refills | Status: DC
  Filled 2022-11-27: qty 30, 30d supply, fill #0
  Filled 2022-12-26: qty 30, 30d supply, fill #1
  Filled 2022-12-26: qty 90, 90d supply, fill #1
  Filled 2023-05-01: qty 30, 30d supply, fill #2

## 2022-11-27 MED ORDER — CARVEDILOL 6.25 MG PO TABS
6.2500 mg | ORAL_TABLET | Freq: Two times a day (BID) | ORAL | 1 refills | Status: DC
Start: 2022-11-27 — End: 2023-07-03
  Filled 2022-11-27 – 2022-12-26 (×2): qty 180, 90d supply, fill #0
  Filled 2023-05-01 (×2): qty 180, 90d supply, fill #1

## 2022-11-27 NOTE — Progress Notes (Signed)
Patient ID: Jason Kent, male    DOB: 20-Apr-1961  MRN: [id number]  CC: Hypertension (HTN f/u. Med refills. /Low appetite, dizziness, fatigue X5-6 mo)   Subjective: Jason Kent is a 62 y.o. male who presents for chronic ds management His concerns today include:  Patient with history of HTN, IDA (resolved), tobacco dependence, HL,   AMN Language interpreter used during this encounter. #[id number], Monica  Reports decreased appetite x 5-6 mths.  Eats fruits, cucumber and drinks juice. Does not eat breakfast (not new), for lunch he would have noodles and fruits or porage and fruits, dinner -rice porage or noodles.  Eating the usual things he always has just less of it.  Quit eating meat 2-3 yrs ago Food no longer smells good especially foods with spices Looking at wgh trend, he is down 18 lbs since 05/2022. -reported palpitations on last visit which he states has resolved Reports normal BM every morning.  No blood in stools or urine.  Passing urine okay; no blood in urine.  No abdominal pain/diarrhea No fever/night sweats/chronic cough. No depress feelings.  Goes to work and loves to work in his garden -Seen at urgent care 09/29/2022 with vomiting and burping.  Lab test revealed blood sugar of 126.  AST and ALT elevated at 147/53 and T. Bili 1.9.  Placed on Omeprazole and Zofran and told to f/u with PCP.  No vomiting since then.  Request RF -had + FIT test earlier; referred to GI but never able to reach pt on phone -Seen at urgent care 09/29/2022 with vomiting and burping.  Lab test revealed blood sugar of 126.  AST and ALT elevated at 147/53 and T. Bili 1.9.  A1C today is 5.4.  Reports he quit drinking "hard drinks" 10 yrs ago.  10 yrs ago he use to drink every day at nights.  Now drinks 1 can of beer once a wk when with friends.   Referred to cardiology Dr. Eldridge Dace on last visit due to palpitations and previous history of chest pains.  Zio patch x 2 weeks revealed rare PACs/PVCs for which  patient was not symptomatic.  CTA coronary was also ordered but has not been done.  Of note, TSH done in January of this year was 0.187.  At that point I had wanted to start him on Tapazole but we were unable to reach him via phone.  Never got the med.  HTN: Should be on Norvasc 10 mg daily and Coreg 6.25 mg twice a day.  Does not have Norvasc and not sure if he is taking.  Only has Coreg bottle with him  HL: Should be on atorvastatin 40 mg 1/2 tab daily.  Had recent refill.  Last LDL was 85 1 year ago.  Patient Active Problem List   Diagnosis Date Noted   Tachycardia 11/29/2019   Headache syndrome 11/29/2019   Iron deficiency anemia 06/18/2018   Mixed hyperlipidemia 06/18/2018   Essential hypertension 05/10/2018   Tobacco dependence 05/10/2018     Current Outpatient Medications on File Prior to Visit  Medication Sig Dispense Refill   atorvastatin (LIPITOR) 40 MG tablet Take 0.5 tablets (20 mg total) by mouth daily. 30 tablet 6   carvedilol (COREG) 6.25 MG tablet Take 1 tablet (6.25 mg total) by mouth 2 (two) times daily with a meal. 60 tablet 6   amLODipine (NORVASC) 10 MG tablet Take 1 tablet (10 mg total) by mouth daily. (Patient not taking: Reported on 11/27/2022) 30 tablet 0  methimazole (TAPAZOLE) 5 MG tablet Take 1 tablet (5 mg total) by mouth daily. (Patient not taking: Reported on 11/27/2022) 30 tablet 2   omeprazole (PRILOSEC) 40 MG capsule Take 1 capsule (40 mg total) by mouth daily. (Patient not taking: Reported on 11/27/2022) 30 capsule 1   ondansetron (ZOFRAN-ODT) 4 MG disintegrating tablet Take 1 tablet (4 mg total) by mouth every 8 (eight) hours as needed for nausea or vomiting. (Patient not taking: Reported on 11/27/2022) 10 tablet 0   [DISCONTINUED] ferrous sulfate (FERROUSUL) 325 (65 FE) MG tablet Take 1 tablet (325 mg total) by mouth daily with breakfast. 60 tablet 1   No current facility-administered medications on file prior to visit.    No Known Allergies  Social  History   Socioeconomic History   Marital status: Married    Spouse name: Not on file   Number of children: Not on file   Years of education: Not on file   Highest education level: Not on file  Occupational History   Not on file  Tobacco Use   Smoking status: Former    Current packs/day: 0.25    Types: Cigarettes   Smokeless tobacco: Current    Types: Chew  Vaping Use   Vaping status: Never Used  Substance and Sexual Activity   Alcohol use: Yes    Alcohol/week: 2.0 standard drinks of alcohol    Types: 2 Cans of beer per week   Drug use: No   Sexual activity: Not on file  Other Topics Concern   Not on file  Social History Narrative   Not on file   Social Determinants of Health   Financial Resource Strain: Not on file  Food Insecurity: Not on file  Transportation Needs: Not on file  Physical Activity: Not on file  Stress: Not on file  Social Connections: Not on file  Intimate Partner Violence: Not on file    Family History  Problem Relation Age of Onset   Hypertension Brother     No past surgical history on file.  ROS: Review of Systems Negative except as stated above  PHYSICAL EXAM: BP 126/81 (BP Location: Left Arm, Patient Position: Sitting, Cuff Size: Normal)   Pulse 61   Temp 98.5 F (36.9 C) (Oral)   Ht 5\' 6"  (1.676 m)   Wt 156 lb (70.8 kg)   SpO2 100%   BMI 25.18 kg/m   Wt Readings from Last 3 Encounters:  11/27/22 156 lb (70.8 kg)  08/06/22 170 lb 12.8 oz (77.5 kg)  05/19/22 174 lb (78.9 kg)    Physical Exam   General appearance - alert, well appearing, and in no distress Mental status - normal mood, behavior, speech, dress, motor activity, and thought processes Eyes -nonicteric sclera.  Pink conjunctiva. Mouth - mucous membranes moist, pharynx normal without lesions Neck - supple, no significant adenopathy.  No thyroid enlargement. Chest - clear to auscultation, no wheezes, rales or rhonchi, symmetric air entry Heart - normal rate,  regular rhythm, normal S1, S2, no murmurs, rubs, clicks or gallops Abdomen - soft, nontender, nondistended, no masses or organomegaly Extremities - peripheral pulses normal, no pedal edema, no clubbing or cyanosis    05/19/2022    9:34 AM 12/27/2021    2:07 PM 02/28/2021    3:32 PM  Depression screen PHQ 2/9  Decreased Interest 0 0 0  Down, Depressed, Hopeless 0 0 0  PHQ - 2 Score 0 0 0      12/27/2021    2:07  PM 02/28/2021    3:32 PM 11/29/2019    2:06 PM 06/18/2018   10:20 AM  GAD 7 : Generalized Anxiety Score  Nervous, Anxious, on Edge 0 0 0 --  Control/stop worrying 0 0 0 --  Worry too much - different things 0 0 0 --  Trouble relaxing 0 0 0 --  Restless 0 0 0 --  Easily annoyed or irritable 0 0 0 --  Afraid - awful might happen 0 0 0 --  Total GAD 7 Score 0 0 0         Latest Ref Rng & Units 09/29/2022   11:17 AM 08/06/2022   10:44 AM 05/19/2022   10:59 AM  CMP  Glucose 70 - 99 mg/dL 409  811  914   BUN 6 - 20 mg/dL 13  7  11    Creatinine 0.61 - 1.24 mg/dL 7.82  9.56  2.13   Sodium 135 - 145 mmol/L 134  139  140   Potassium 3.5 - 5.1 mmol/L 4.6  4.3  4.2   Chloride 98 - 111 mmol/L 101  103  102   CO2 22 - 32 mmol/L 19  21  18    Calcium 8.9 - 10.3 mg/dL 8.8  9.5  9.7   Total Protein 6.5 - 8.1 g/dL 7.6     Total Bilirubin 0.3 - 1.2 mg/dL 1.9     Alkaline Phos 38 - 126 U/L 110     AST 15 - 41 U/L 147     ALT 0 - 44 U/L 54      Lipid Panel     Component Value Date/Time   CHOL 206 (H) 12/27/2021 1459   TRIG 288 (H) 12/27/2021 1459   HDL 74 12/27/2021 1459   CHOLHDL 2.8 12/27/2021 1459   LDLCALC 85 12/27/2021 1459    CBC    Component Value Date/Time   WBC 5.1 09/29/2022 1117   RBC 4.57 09/29/2022 1117   HGB 15.2 09/29/2022 1117   HGB 14.2 12/27/2021 1459   HCT 44.8 09/29/2022 1117   HCT 43.0 12/27/2021 1459   PLT 302 09/29/2022 1117   PLT 240 12/27/2021 1459   MCV 98.0 09/29/2022 1117   MCV 94 12/27/2021 1459   MCH 33.3 09/29/2022 1117   MCHC 33.9  09/29/2022 1117   RDW 12.9 09/29/2022 1117   RDW 13.3 12/27/2021 1459   Results for orders placed or performed in visit on 11/27/22  POCT glycosylated hemoglobin (Hb A1C)  Result Value Ref Range   Hemoglobin A1C     HbA1c POC (<> result, manual entry)     HbA1c, POC (prediabetic range) 5.4 (A) 5.7 - 6.4 %   HbA1c, POC (controlled diabetic range)      ASSESSMENT AND PLAN: 1. Unintentional weight loss Patient reports decreased appetite.  History not suggestive of depression.  We will try to get him up-to-date with age-appropriate cancer screenings and recheck thyroid panel.  If TSH still significantly low, we will start Tapazole.  Will try to get him in with gastroenterology again especially given he had a positive fit test.  Advised patient that he has to figure out a way where by he can be reached via telephone to schedule appointment.  Patient states that his son's phone number is on the chart because he works during the day and is unable to answer the telephone.  He wanted me to give him the phone number to the gastroenterologist so that his son can call and  set up the appointment. - TSH+T4F+T3Free - Comprehensive metabolic panel - Ambulatory referral to Gastroenterology - HIV antibody (with reflex)  2. Positive FIT (fecal immunochemical test) - Ambulatory referral to Gastroenterology  3. Abnormal LFTs Stop/hold atorvastatin - Comprehensive metabolic panel - Hepatitis C Antibody - Hepatitis B surface antigen - Hepatitis B surface antibody,quantitative - Ambulatory referral to Gastroenterology  4. Abnormal thyroid screen (blood) - TSH+T4F+T3Free  5. Prostate cancer screening Agreeable to PSA level check. - PSA  6. Essential hypertension Close to goal.  Message sent to clinical pharmacist to inquire whether or not the patient has been filling prescription for amlodipine - carvedilol (COREG) 6.25 MG tablet; Take 1 tablet (6.25 mg total) by mouth 2 (two) times daily with a  meal.  Dispense: 180 tablet; Refill: 1  7. Mixed hyperlipidemia Hold Lipitor due to abnormal LFTs - Lipid panel  8. Prediabetes No longer in range for prediabetes - POCT glycosylated hemoglobin (Hb A1C) - POCT glucose (manual entry)  9. Palpitations Resolved per patient.    Patient was given the opportunity to ask questions.  Patient verbalized understanding of the plan and was able to repeat key elements of the plan.   This documentation was completed using Paediatric nurse.  Any transcriptional errors are unintentional.  Orders Placed This Encounter  Procedures   POCT glycosylated hemoglobin (Hb A1C)   POCT glucose (manual entry)     Requested Prescriptions   Pending Prescriptions Disp Refills   atorvastatin (LIPITOR) 40 MG tablet 45 tablet 1    Sig: Take 0.5 tablets (20 mg total) by mouth daily.   carvedilol (COREG) 6.25 MG tablet 180 tablet 1    Sig: Take 1 tablet (6.25 mg total) by mouth 2 (two) times daily with a meal.    No follow-ups on file.  Jonah Blue, MD, FACP

## 2022-11-28 ENCOUNTER — Other Ambulatory Visit: Payer: Self-pay | Admitting: Internal Medicine

## 2022-11-28 DIAGNOSIS — R7989 Other specified abnormal findings of blood chemistry: Secondary | ICD-10-CM

## 2022-11-28 LAB — COMPREHENSIVE METABOLIC PANEL
Creatinine, Ser: 0.7 mg/dL — ABNORMAL LOW (ref 0.76–1.27)
Sodium: 138 mmol/L (ref 134–144)

## 2022-12-05 ENCOUNTER — Encounter: Payer: Self-pay | Admitting: Physician Assistant

## 2022-12-26 ENCOUNTER — Other Ambulatory Visit: Payer: Self-pay

## 2022-12-26 ENCOUNTER — Ambulatory Visit: Payer: Self-pay | Admitting: Internal Medicine

## 2022-12-29 ENCOUNTER — Other Ambulatory Visit: Payer: Self-pay

## 2023-02-18 NOTE — Progress Notes (Deleted)
02/18/2023 Jason Kent [id number] 12/30/1960  Referring provider: Marcine Matar, MD Primary GI doctor: {acdocs:[id number]}  ASSESSMENT AND PLAN:   Assessment and Plan              Patient Care Team: Marcine Matar, MD as PCP - General (Internal Medicine) Corky Crafts, MD as PCP - Cardiology (Cardiology)  HISTORY OF PRESENT ILLNESS: 62 y.o. Nepali speaking new male patient with a past medical history of hypertension, hyperlipidemia, history of tobacco abuse, IDA and others listed below presents for evaluation of positive FIT, weight loss, elevated LFTs.   05/19/2022 fit test positive 09/29/2022 AST 147, ALT 54, alk phos 110, total bilirubin 1.9, albumin 3.6, CBC with no leukocytosis, anemia, normal thyroid 11/27/2022 AST 246, ALT 94, alk phos 210, total bilirubin 1.3, albumin 3.4, hepatitis C negative hepatitis B surface antigen negative,  Hepatitis B surface antibody negative  no recent imaging  Discussed the use of AI scribe software for clinical note transcription with the patient, who gave verbal consent to proceed.  History of Present Illness             He {Actions; denies-reports:120008} blood thinner use.  He {Actions; denies-reports:120008} NSAID use.  He {Actions; denies-reports:120008} ETOH use.   He {Actions; denies-reports:120008} tobacco use.  He {Actions; denies-reports:120008} drug use.    He  reports that he has quit smoking. His smoking use included cigarettes. His smokeless tobacco use includes chew. He reports current alcohol use of about 2.0 standard drinks of alcohol per week. He reports that he does not use drugs.  RELEVANT LABS AND IMAGING:  Results          CBC    Component Value Date/Time   WBC 5.1 09/29/2022 1117   RBC 4.57 09/29/2022 1117   HGB 15.2 09/29/2022 1117   HGB 14.2 12/27/2021 1459   HCT 44.8 09/29/2022 1117   HCT 43.0 12/27/2021 1459   PLT 302 09/29/2022 1117   PLT 240 12/27/2021 1459   MCV  98.0 09/29/2022 1117   MCV 94 12/27/2021 1459   MCH 33.3 09/29/2022 1117   MCHC 33.9 09/29/2022 1117   RDW 12.9 09/29/2022 1117   RDW 13.3 12/27/2021 1459   Recent Labs    09/29/22 1117  HGB 15.2    CMP     Component Value Date/Time   NA 138 11/27/2022 1045   K 4.6 11/27/2022 1045   CL 104 11/27/2022 1045   CO2 21 11/27/2022 1045   GLUCOSE 103 (H) 11/27/2022 1045   GLUCOSE 126 (H) 09/29/2022 1117   BUN 7 (L) 11/27/2022 1045   CREATININE 0.70 (L) 11/27/2022 1045   CALCIUM 8.9 11/27/2022 1045   PROT 6.7 11/27/2022 1045   ALBUMIN 3.4 (L) 11/27/2022 1045   AST 246 (H) 11/27/2022 1045   ALT 94 (H) 11/27/2022 1045   ALKPHOS 210 (H) 11/27/2022 1045   BILITOT 1.3 (H) 11/27/2022 1045   GFRNONAA >60 09/29/2022 1117   GFRAA 111 11/29/2019 1448      Latest Ref Rng & Units 11/27/2022   10:45 AM 09/29/2022   11:17 AM 12/27/2021    2:59 PM  Hepatic Function  Total Protein 6.0 - 8.5 g/dL 6.7  7.6  8.5   Albumin 3.8 - 4.9 g/dL 3.4  3.6  5.1   AST 0 - 40 IU/L 246  147  33   ALT 0 - 44 IU/L 94  54  22   Alk Phosphatase 44 - 121 IU/L  210  110  121   Total Bilirubin 0.0 - 1.2 mg/dL 1.3  1.9  1.1       Current Medications:    Current Outpatient Medications (Cardiovascular):    amLODipine (NORVASC) 10 MG tablet, Take 1 tablet (10 mg total) by mouth daily. (Patient not taking: Reported on 11/27/2022)   carvedilol (COREG) 6.25 MG tablet, Take 1 tablet (6.25 mg total) by mouth 2 (two) times daily with a meal.     Current Outpatient Medications (Other):    omeprazole (PRILOSEC) 40 MG capsule, Take 1 capsule (40 mg total) by mouth daily.  Medical History:  Past Medical History:  Diagnosis Date   Headache syndrome    HLD (hyperlipidemia)    Hypertension    Iron deficiency anemia    Palpitations    Tachycardia    Tobacco dependency    Allergies: No Known Allergies   Surgical History:  He  has no past surgical history on file. Family History:  His family history includes  Hypertension in his brother.  REVIEW OF SYSTEMS  : All other systems reviewed and negative except where noted in the History of Present Illness.  PHYSICAL EXAM: There were no vitals taken for this visit. General Appearance: Well nourished, in no apparent distress. Head:   Normocephalic and atraumatic. Eyes:  sclerae anicteric,conjunctive pink  Respiratory: Respiratory effort normal, BS equal bilaterally without rales, rhonchi, wheezing. Cardio: RRR with no MRGs. Peripheral pulses intact.  Abdomen: Soft,  {BlankSingle:19197::"Flat","Obese","Non-distended"} ,active bowel sounds. {actendernessAB:27319} tenderness {anatomy; site abdomen:5010}. {BlankMultiple:19196::"Without guarding","With guarding","Without rebound","With rebound"}. No masses. Rectal: {acrectalexam:27461} Musculoskeletal: Full ROM, {PSY - GAIT AND STATION:22860} gait. {With/Without:304960234} edema. Skin:  Dry and intact without significant lesions or rashes Neuro: Alert and  oriented x4;  No focal deficits. Psych:  Cooperative. Normal mood and affect.    Doree Albee, PA-C 12:24 PM

## 2023-02-19 ENCOUNTER — Ambulatory Visit: Payer: Self-pay | Admitting: Physician Assistant

## 2023-03-03 ENCOUNTER — Encounter: Payer: Self-pay | Admitting: Internal Medicine

## 2023-03-03 ENCOUNTER — Ambulatory Visit: Payer: Self-pay | Attending: Internal Medicine | Admitting: Internal Medicine

## 2023-03-03 ENCOUNTER — Other Ambulatory Visit: Payer: Self-pay

## 2023-03-03 VITALS — BP 138/86 | HR 71 | Temp 99.0°F | Ht 66.0 in | Wt 153.0 lb

## 2023-03-03 DIAGNOSIS — R195 Other fecal abnormalities: Secondary | ICD-10-CM

## 2023-03-03 DIAGNOSIS — I10 Essential (primary) hypertension: Secondary | ICD-10-CM

## 2023-03-03 DIAGNOSIS — Z23 Encounter for immunization: Secondary | ICD-10-CM

## 2023-03-03 DIAGNOSIS — R7989 Other specified abnormal findings of blood chemistry: Secondary | ICD-10-CM

## 2023-03-03 DIAGNOSIS — R634 Abnormal weight loss: Secondary | ICD-10-CM

## 2023-03-03 DIAGNOSIS — D171 Benign lipomatous neoplasm of skin and subcutaneous tissue of trunk: Secondary | ICD-10-CM

## 2023-03-03 MED ORDER — ZOSTER VAC RECOMB ADJUVANTED 50 MCG/0.5ML IM SUSR
0.5000 mL | Freq: Once | INTRAMUSCULAR | 0 refills | Status: AC
Start: 2023-03-03 — End: 2023-03-03

## 2023-03-03 MED ORDER — AMLODIPINE BESYLATE 5 MG PO TABS
5.0000 mg | ORAL_TABLET | Freq: Every day | ORAL | 3 refills | Status: DC
Start: 2023-03-03 — End: 2023-07-03

## 2023-03-03 MED ORDER — FLULAVAL 0.5 ML IM SUSY
0.5000 mL | PREFILLED_SYRINGE | Freq: Once | INTRAMUSCULAR | 0 refills | Status: AC
  Filled 2023-03-03: qty 0.5, 1d supply, fill #0

## 2023-03-03 NOTE — Patient Instructions (Signed)
Please call [facility] to schedule your colonoscopy.Marland Kitchen

## 2023-03-03 NOTE — Progress Notes (Signed)
Patient ID: Jason Kent, male    DOB: 21-Apr-1961  MRN: [id number]  CC: Hypertension (HTN f/u.  /No questions / concerns/Yes to flu vax)   Subjective: Ajamu Cardullo is a 62 y.o. male who presents for chronic ds management. His concerns today include:  Patient with history of HTN, IDA (resolved), tobacco dependence, HL,   AMN Language interpreter used during this encounter. #Gelu [id number]  HTN: reports taking he took both BP meds already for the morning.  Should be on Coreg 6.25 mg BID and Norvasc 10 mg Brought meds with him but left them in waiting area with wife. We did get meds from wife - he has Omeprazole and Coreg.  Does not have the Norvasc  -no CP/SOB/LE  On last visit, pt's main concern was unintentional wgh loss.  Had +FIT test. -referred to GI.  Pt no-showed appt with [facility] 02/19/2023.  Pt report the appt info was with his son who was on vacation at the time and he (pt himself) was busy with work. Other work up including HIV, thyroid panel, PSA normal.  LFT was abnormal AST/ALT, T. bil remained elev in ETOH pattern.  Reports he does not drink at all; use to in past.  He has remained off statin.  Reports no longer having wgh loss and appetite has improve.  Patient Active Problem List   Diagnosis Date Noted   Tachycardia 11/29/2019   Headache syndrome 11/29/2019   Iron deficiency anemia 06/18/2018   Mixed hyperlipidemia 06/18/2018   Essential hypertension 05/10/2018   Tobacco dependence 05/10/2018     Current Outpatient Medications on File Prior to Visit  Medication Sig Dispense Refill   carvedilol (COREG) 6.25 MG tablet Take 1 tablet (6.25 mg total) by mouth 2 (two) times daily with a meal. 180 tablet 1   omeprazole (PRILOSEC) 40 MG capsule Take 1 capsule (40 mg total) by mouth daily. 30 capsule 4   [DISCONTINUED] ferrous sulfate (FERROUSUL) 325 (65 FE) MG tablet Take 1 tablet (325 mg total) by mouth daily with breakfast. 60 tablet 1   No current  facility-administered medications on file prior to visit.    No Known Allergies  Social History   Socioeconomic History   Marital status: Married    Spouse name: Not on file   Number of children: Not on file   Years of education: Not on file   Highest education level: Not on file  Occupational History   Not on file  Tobacco Use   Smoking status: Former    Current packs/day: 0.25    Types: Cigarettes   Smokeless tobacco: Current    Types: Chew  Vaping Use   Vaping status: Never Used  Substance and Sexual Activity   Alcohol use: Yes    Alcohol/week: 2.0 standard drinks of alcohol    Types: 2 Cans of beer per week   Drug use: No   Sexual activity: Not on file  Other Topics Concern   Not on file  Social History Narrative   Not on file   Social Determinants of Health   Financial Resource Strain: Not on file  Food Insecurity: Not on file  Transportation Needs: Not on file  Physical Activity: Not on file  Stress: Not on file  Social Connections: Not on file  Intimate Partner Violence: Not on file    Family History  Problem Relation Age of Onset   Hypertension Brother     No past surgical history on file.  ROS:  Review of Systems Negative except as stated above  PHYSICAL EXAM: BP 138/86 (BP Location: Left Arm, Patient Position: Sitting, Cuff Size: Normal)   Pulse 71   Temp 99 F (37.2 C) (Oral)   Ht 5\' 6"  (1.676 m)   Wt 153 lb (69.4 kg)   SpO2 99%   BMI 24.69 kg/m   Wt Readings from Last 3 Encounters:  03/03/23 153 lb (69.4 kg)  11/27/22 156 lb (70.8 kg)  08/06/22 170 lb 12.8 oz (77.5 kg)  BP 144/94  Physical Exam   General appearance - alert, well appearing, and in no distress Mental status - normal mood, behavior, speech, dress, motor activity, and thought processes Chest - clear to auscultation, no wheezes, rales or rhonchi, symmetric air entry Heart - normal rate, regular rhythm, normal S1, S2, no murmurs, rubs, clicks or gallops Abdomen  -normal bowel sounds, soft, nontender, no organomegaly.  He has a 4 x 4 centimeter soft movable superficial mass likely lipoma palpated in the left upper quadrant close to the epigastric area Extremities - peripheral pulses normal, no pedal edema, no clubbing or cyanosis     Latest Ref Rng & Units 11/27/2022   10:45 AM 09/29/2022   11:17 AM 08/06/2022   10:44 AM  CMP  Glucose 70 - 99 mg/dL 761  607  371   BUN 8 - 27 mg/dL 7  13  7    Creatinine 0.76 - 1.27 mg/dL 0.62  6.94  8.54   Sodium 134 - 144 mmol/L 138  134  139   Potassium 3.5 - 5.2 mmol/L 4.6  4.6  4.3   Chloride 96 - 106 mmol/L 104  101  103   CO2 20 - 29 mmol/L 21  19  21    Calcium 8.6 - 10.2 mg/dL 8.9  8.8  9.5   Total Protein 6.0 - 8.5 g/dL 6.7  7.6    Total Bilirubin 0.0 - 1.2 mg/dL 1.3  1.9    Alkaline Phos 44 - 121 IU/L 210  110    AST 0 - 40 IU/L 246  147    ALT 0 - 44 IU/L 94  54     Lipid Panel     Component Value Date/Time   CHOL 192 11/27/2022 1045   TRIG 405 (H) 11/27/2022 1045   HDL 17 (L) 11/27/2022 1045   CHOLHDL 11.3 (H) 11/27/2022 1045   LDLCALC 105 (H) 11/27/2022 1045    CBC    Component Value Date/Time   WBC 5.1 09/29/2022 1117   RBC 4.57 09/29/2022 1117   HGB 15.2 09/29/2022 1117   HGB 14.2 12/27/2021 1459   HCT 44.8 09/29/2022 1117   HCT 43.0 12/27/2021 1459   PLT 302 09/29/2022 1117   PLT 240 12/27/2021 1459   MCV 98.0 09/29/2022 1117   MCV 94 12/27/2021 1459   MCH 33.3 09/29/2022 1117   MCHC 33.9 09/29/2022 1117   RDW 12.9 09/29/2022 1117   RDW 13.3 12/27/2021 1459    ASSESSMENT AND PLAN: 1. Essential hypertension Not at goal.  He has been taking just the Coreg 6.25 mg BID.  Looks like he has been out of Norvasc for a while.  Will restart at lower dose of 5 mg daily.  I printed rxn and gave to him to take to pharmacy - amLODipine (NORVASC) 5 MG tablet; Take 1 tablet (5 mg total) by mouth daily.  Dispense: 90 tablet; Refill: 3  2. Unintentional weight loss Down 3 lbs since last visit  in July.  Patient reports that his appetite has improved.  He is no longer concerned with his weight.  At baseline workup so far is negative.  However he still needs to see the gastroenterologist for colon cancer screening to complete his workup and because of positive fit test.  Strongly advised him of the importance of seeing the gastroenterologist for this.  He is agreeable to rescheduling through his son - Ambulatory referral to Gastroenterology  3. Positive FIT (fecal immunochemical test) - Ambulatory referral to Gastroenterology  4. Abnormal LFTs ? Etiology.  Elev in ETOH pattern but pt denies active ETOH use.  If studies come back more elev, will refer for [place] - Ambulatory referral to Gastroenterology - Hepatic Function Panel  5. Need for influenza vaccination Will get in pharmacy downstairs  6. Need for shingles vaccine - Zoster Vaccine Adjuvanted [facility]) injection; Inject 0.5 mLs into the muscle once for 1 dose.  Dispense: 0.5 mL; Refill: 0  7. Lipoma of abdominal wall Observe for now. Pt states this was present and unchanged for past 30 yrs.    Patient was given the opportunity to ask questions.  Patient verbalized understanding of the plan and was able to repeat key elements of the plan.   This documentation was completed using Paediatric nurse.  Any transcriptional errors are unintentional.  Orders Placed This Encounter  Procedures   Hepatic Function Panel   Ambulatory referral to Gastroenterology     Requested Prescriptions   Signed Prescriptions Disp Refills   Zoster Vaccine Adjuvanted [facility]) injection 0.5 mL 0    Sig: Inject 0.5 mLs into the muscle once for 1 dose.   amLODipine (NORVASC) 5 MG tablet 90 tablet 3    Sig: Take 1 tablet (5 mg total) by mouth daily.    Return in about 4 months (around 07/03/2023).  Jonah Blue, MD, FACP

## 2023-03-04 LAB — HEPATIC FUNCTION PANEL
ALT: 43 [IU]/L (ref 0–44)
AST: 159 [IU]/L — ABNORMAL HIGH (ref 0–40)
Albumin: 3.5 g/dL — ABNORMAL LOW (ref 3.9–4.9)
Alkaline Phosphatase: 171 [IU]/L — ABNORMAL HIGH (ref 44–121)
Bilirubin Total: 0.9 mg/dL (ref 0.0–1.2)
Bilirubin, Direct: 0.33 mg/dL (ref 0.00–0.40)
Total Protein: 7.3 g/dL (ref 6.0–8.5)

## 2023-03-06 ENCOUNTER — Other Ambulatory Visit: Payer: Self-pay

## 2023-03-06 MED ORDER — ZOSTER VAC RECOMB ADJUVANTED 50 MCG/0.5ML IM SUSR
0.5000 mL | Freq: Once | INTRAMUSCULAR | 1 refills | Status: AC
  Filled 2023-07-03: qty 1, 1d supply, fill #0

## 2023-03-16 ENCOUNTER — Other Ambulatory Visit: Payer: Self-pay

## 2023-05-01 ENCOUNTER — Other Ambulatory Visit: Payer: Self-pay

## 2023-05-12 ENCOUNTER — Encounter: Payer: Self-pay | Admitting: Gastroenterology

## 2023-06-15 ENCOUNTER — Other Ambulatory Visit: Payer: Self-pay

## 2023-06-15 ENCOUNTER — Other Ambulatory Visit: Payer: Self-pay | Admitting: Internal Medicine

## 2023-06-15 MED ORDER — OMEPRAZOLE 40 MG PO CPDR
40.0000 mg | DELAYED_RELEASE_CAPSULE | Freq: Every day | ORAL | 0 refills | Status: DC
  Filled 2023-06-15: qty 30, 30d supply, fill #0

## 2023-06-16 ENCOUNTER — Other Ambulatory Visit: Payer: Self-pay

## 2023-07-02 NOTE — Progress Notes (Deleted)
 Chief Complaint: weight loss, positive FIT, elevated LFT's Primary GI Doctor:unassigned  HPI:  Patient is a  63 year old male patient with past medical history of hyperlipidemia, hypertension, IDA, who was referred to me by Marcine Matar, MD on 03/03/23 for a complaint of weight loss, positive FIT, elevated LFT's .    Interval History  Patient admits/denies GERD Patient taking omeprazole 40 mg p.o. daily Patient admits/denies dysphagia Patient admits/denies nausea, vomiting, or weight loss 03/03/23 Reports no longer having wgh loss and appetite has improve.   Patient admits/denies altered bowel habits Patient admits/denies abdominal pain Patient admits/denies rectal bleeding  Elevated liver enzymes Patient was taken off statin for *** Reports he quit drinking "hard drinks" 10 yrs ago.  Now drinks 1 can of beer once a wk when with friends.   Patient has decreased appetite for ***   Denies/Admits alcohol Denies/Admits smoking Denies/Admits NSAID use. Denies/Admits they are on blood thinners.  Patients last colonoscopy Patients last EGD  Patient's family history includes  Wt Readings from Last 3 Encounters:  03/03/23 153 lb (69.4 kg)  11/27/22 156 lb (70.8 kg)  08/06/22 170 lb 12.8 oz (77.5 kg)      Past Medical History:  Diagnosis Date   Headache syndrome    HLD (hyperlipidemia)    Hypertension    Iron deficiency anemia    Palpitations    Tachycardia    Tobacco dependency     No past surgical history on file.  Current Outpatient Medications  Medication Sig Dispense Refill   amLODipine (NORVASC) 5 MG tablet Take 1 tablet (5 mg total) by mouth daily. 90 tablet 3   carvedilol (COREG) 6.25 MG tablet Take 1 tablet (6.25 mg total) by mouth 2 (two) times daily with a meal. 180 tablet 1   omeprazole (PRILOSEC) 40 MG capsule Take 1 capsule (40 mg total) by mouth daily. 30 capsule 0   No current facility-administered medications for this visit.     Allergies as of 07/06/2023   (No Known Allergies)    Family History  Problem Relation Age of Onset   Hypertension Brother     Review of Systems:    Constitutional: No weight loss, fever, chills, weakness or fatigue HEENT: Eyes: No change in vision               Ears, Nose, Throat:  No change in hearing or congestion Skin: No rash or itching Cardiovascular: No chest pain, chest pressure or palpitations   Respiratory: No SOB or cough Gastrointestinal: See HPI and otherwise negative Genitourinary: No dysuria or change in urinary frequency Neurological: No headache, dizziness or syncope Musculoskeletal: No new muscle or joint pain Hematologic: No bleeding or bruising Psychiatric: No history of depression or anxiety    Physical Exam:  Vital signs: There were no vitals taken for this visit.  Constitutional:   Pleasant Caucasian male*** appears to be in NAD, Well developed, Well nourished, alert and cooperative Head:  Normocephalic and atraumatic. Eyes:   PEERL, EOMI. No icterus. Conjunctiva pink. Ears:  Normal auditory acuity. Neck:  Supple Throat: Oral cavity and pharynx without inflammation, swelling or lesion.  Respiratory: Respirations even and unlabored. Lungs clear to auscultation bilaterally.   No wheezes, crackles, or rhonchi.  Cardiovascular: Normal S1, S2. Regular rate and rhythm. No peripheral edema, cyanosis or pallor.  Gastrointestinal:  Soft, nondistended, nontender. No rebound or guarding. Normal bowel sounds. No appreciable masses or hepatomegaly. Rectal:  Not performed.  Anoscopy: Msk:  Symmetrical without  gross deformities. Without edema, no deformity or joint abnormality.  Neurologic:  Alert and  oriented x4;  grossly normal neurologically.  Skin:   Dry and intact without significant lesions or rashes. Psychiatric: Oriented to person, place and time. Demonstrates good judgement and reason without abnormal affect or behaviors.  RELEVANT LABS AND  IMAGING: CBC    Latest Ref Rng & Units 09/29/2022   11:17 AM 12/27/2021    2:59 PM 10/30/2020    4:03 PM  CBC  WBC 4.0 - 10.5 K/uL 5.1  6.9  5.5   Hemoglobin 13.0 - 17.0 g/dL 16.1  09.6  04.5   Hematocrit 39.0 - 52.0 % 44.8  43.0  42.1   Platelets 150 - 400 K/uL 302  240  226      CMP     Latest Ref Rng & Units 03/03/2023    9:57 AM 11/27/2022   10:45 AM 09/29/2022   11:17 AM  CMP  Glucose 70 - 99 mg/dL  409  811   BUN 8 - 27 mg/dL  7  13   Creatinine 9.14 - 1.27 mg/dL  7.82  9.56   Sodium 213 - 144 mmol/L  138  134   Potassium 3.5 - 5.2 mmol/L  4.6  4.6   Chloride 96 - 106 mmol/L  104  101   CO2 20 - 29 mmol/L  21  19   Calcium 8.6 - 10.2 mg/dL  8.9  8.8   Total Protein 6.0 - 8.5 g/dL 7.3  6.7  7.6   Total Bilirubin 0.0 - 1.2 mg/dL 0.9  1.3  1.9   Alkaline Phos 44 - 121 IU/L 171  210  110   AST 0 - 40 IU/L 159  246  147   ALT 0 - 44 IU/L 43  94  54      Lab Results  Component Value Date   TSH 0.537 11/27/2022  05/19/22 Fecal occult positive 11/27/22 labs show: HIV non reactive, Hep B Surf Ab Quant negative, Hep B Surface Ag negative, Hep C nonreactive  Assessment: 1. ***  Plan: -ANA, AMA, Anti-smooth muscle antibody, Hepatitis A IgG, ferritin, TIBC,  Alpha 1 antitrypsin, ceruloplasmin, tTG, total IgA, IgG  -Order Abdominal ultrasound, complete  - Schedule EGD. The risks and benefits of EGD with possible biopsies and esophageal dilation were discussed with the patient who agrees to proceed.  -Schedule for a colonoscopy. The risks and benefits of colonoscopy with possible polypectomy / biopsies were discussed and the patient agrees to proceed.    Thank you for the courtesy of this consult. Please call me with any questions or concerns.   Penny Frisbie, FNP-C [facility] 07/02/2023, 4:29 PM  Cc: Marcine Matar, MD

## 2023-07-03 ENCOUNTER — Ambulatory Visit: Payer: Self-pay | Attending: Internal Medicine | Admitting: Internal Medicine

## 2023-07-03 ENCOUNTER — Encounter: Payer: Self-pay | Admitting: Internal Medicine

## 2023-07-03 ENCOUNTER — Other Ambulatory Visit: Payer: Self-pay

## 2023-07-03 VITALS — BP 161/100 | HR 74 | Temp 98.1°F | Ht 66.0 in | Wt 176.6 lb

## 2023-07-03 DIAGNOSIS — I10 Essential (primary) hypertension: Secondary | ICD-10-CM

## 2023-07-03 DIAGNOSIS — Z758 Other problems related to medical facilities and other health care: Secondary | ICD-10-CM

## 2023-07-03 DIAGNOSIS — Z603 Acculturation difficulty: Secondary | ICD-10-CM

## 2023-07-03 DIAGNOSIS — R195 Other fecal abnormalities: Secondary | ICD-10-CM

## 2023-07-03 DIAGNOSIS — R7989 Other specified abnormal findings of blood chemistry: Secondary | ICD-10-CM

## 2023-07-03 DIAGNOSIS — Z23 Encounter for immunization: Secondary | ICD-10-CM

## 2023-07-03 MED ORDER — CARVEDILOL 6.25 MG PO TABS
6.2500 mg | ORAL_TABLET | Freq: Two times a day (BID) | ORAL | 1 refills | Status: DC
  Filled 2023-07-03 – 2023-08-31 (×3): qty 180, 90d supply, fill #0
  Filled 2023-12-17: qty 180, 90d supply, fill #1

## 2023-07-03 MED ORDER — ZOSTER VAC RECOMB ADJUVANTED 50 MCG/0.5ML IM SUSR: 0.5000 mL | Freq: Once | INTRAMUSCULAR | 0 refills | Status: AC

## 2023-07-03 MED ORDER — AMLODIPINE BESYLATE 5 MG PO TABS
5.0000 mg | ORAL_TABLET | Freq: Every day | ORAL | 3 refills | Status: DC
  Filled 2023-07-03: qty 90, 90d supply, fill #0
  Filled 2023-10-02: qty 90, 90d supply, fill #1
  Filled 2023-12-17: qty 90, 90d supply, fill #2
  Filled 2024-03-18 (×2): qty 90, 90d supply, fill #3

## 2023-07-03 NOTE — Progress Notes (Signed)
 Patient ID: Jason Kent, male    DOB: 09/27/1960  MRN: [id number]  CC: Hypertension (HTN f/u. Med refill. /No questions/Yes to shingles vax )   Subjective: Jason Kent is a 63 y.o. male who presents for chronic ds management. His concerns today include:  Patient with history of HTN, IDA (resolved), tobacco dependence, HL,   AMN Language interpreter used during this encounter. #Babina [id number]   HTN: should be on Norvasc 5 mg and Coreg 6.25 mg BID.  He has bottle of Coreg and Omeprazole but no Norvasc. On last visit I actually printed the rxn for Amlodipine and gave to him to take to our pharmacy; looks like it was never dispensed.  -no CP/SOB/LE edema/dizziness  Wgh Loss: looks like he has gain wgh since last visit Sleeping well.  Good appetite.  HL/Abn LFT:  LFTs returning to normal range when checked on last visit 4 mths ago.  Alk phos went from 210 to 171 and AST/ALT 246/94 to 159. We continue to hold statin.  Resubmitted referral to GI for +FIT test.  [facility] unsuccessful in reaching him again x 2.  Reports the # is his son's and he may be busy during the day  HM: given rxn for shingrix on last visit to give to pharmacy.  He did not receive the vaccine  Patient Active Problem List   Diagnosis Date Noted   Tachycardia 11/29/2019   Headache syndrome 11/29/2019   Iron deficiency anemia 06/18/2018   Mixed hyperlipidemia 06/18/2018   Essential hypertension 05/10/2018   Tobacco dependence 05/10/2018     Current Outpatient Medications on File Prior to Visit  Medication Sig Dispense Refill   omeprazole (PRILOSEC) 40 MG capsule Take 1 capsule (40 mg total) by mouth daily. 30 capsule 0   Zoster Vaccine Adjuvanted [facility]) injection Inject 0.5 mLs into the muscle once for 1 dose. 1 each 1   [DISCONTINUED] ferrous sulfate (FERROUSUL) 325 (65 FE) MG tablet Take 1 tablet (325 mg total) by mouth daily with breakfast. 60 tablet 1   No current facility-administered  medications on file prior to visit.    No Known Allergies  Social History   Socioeconomic History   Marital status: Married    Spouse name: Not on file   Number of children: Not on file   Years of education: Not on file   Highest education level: Not on file  Occupational History   Not on file  Tobacco Use   Smoking status: Former    Current packs/day: 0.25    Types: Cigarettes   Smokeless tobacco: Current    Types: Chew  Vaping Use   Vaping status: Never Used  Substance and Sexual Activity   Alcohol use: Yes    Alcohol/week: 2.0 standard drinks of alcohol    Types: 2 Cans of beer per week   Drug use: No   Sexual activity: Not on file  Other Topics Concern   Not on file  Social History Narrative   Not on file   Social Drivers of Health   Financial Resource Strain: Not on file  Food Insecurity: Not on file  Transportation Needs: Not on file  Physical Activity: Not on file  Stress: Not on file  Social Connections: Not on file  Intimate Partner Violence: Not on file    Family History  Problem Relation Age of Onset   Hypertension Brother     No past surgical history on file.  ROS: Review of Systems Negative except  as stated above  PHYSICAL EXAM: BP (!) 161/100   Pulse 74   Temp 98.1 F (36.7 C) (Oral)   Ht 5\' 6"  (1.676 m)   Wt 176 lb 9.6 oz (80.1 kg)   SpO2 98%   BMI 28.50 kg/m   Wt Readings from Last 3 Encounters:  07/03/23 176 lb 9.6 oz (80.1 kg)  03/03/23 153 lb (69.4 kg)  11/27/22 156 lb (70.8 kg)    Physical Exam   General appearance - alert, well appearing, older male and in no distress Mental status - normal mood, behavior, speech, dress, motor activity, and thought processes Neck - supple, no significant adenopathy Chest - clear to auscultation, no wheezes, rales or rhonchi, symmetric air entry Heart - normal rate, regular rhythm, normal S1, S2, no murmurs, rubs, clicks or gallops Extremities - peripheral pulses normal, no pedal  edema, no clubbing or cyanosis     Latest Ref Rng & Units 03/03/2023    9:57 AM 11/27/2022   10:45 AM 09/29/2022   11:17 AM  CMP  Glucose 70 - 99 mg/dL  098  119   BUN 8 - 27 mg/dL  7  13   Creatinine 1.47 - 1.27 mg/dL  8.29  5.62   Sodium 130 - 144 mmol/L  138  134   Potassium 3.5 - 5.2 mmol/L  4.6  4.6   Chloride 96 - 106 mmol/L  104  101   CO2 20 - 29 mmol/L  21  19   Calcium 8.6 - 10.2 mg/dL  8.9  8.8   Total Protein 6.0 - 8.5 g/dL 7.3  6.7  7.6   Total Bilirubin 0.0 - 1.2 mg/dL 0.9  1.3  1.9   Alkaline Phos 44 - 121 IU/L 171  210  110   AST 0 - 40 IU/L 159  246  147   ALT 0 - 44 IU/L 43  94  54    Lipid Panel     Component Value Date/Time   CHOL 192 11/27/2022 1045   TRIG 405 (H) 11/27/2022 1045   HDL 17 (L) 11/27/2022 1045   CHOLHDL 11.3 (H) 11/27/2022 1045   LDLCALC 105 (H) 11/27/2022 1045    CBC    Component Value Date/Time   WBC 5.1 09/29/2022 1117   RBC 4.57 09/29/2022 1117   HGB 15.2 09/29/2022 1117   HGB 14.2 12/27/2021 1459   HCT 44.8 09/29/2022 1117   HCT 43.0 12/27/2021 1459   PLT 302 09/29/2022 1117   PLT 240 12/27/2021 1459   MCV 98.0 09/29/2022 1117   MCV 94 12/27/2021 1459   MCH 33.3 09/29/2022 1117   MCHC 33.9 09/29/2022 1117   RDW 12.9 09/29/2022 1117   RDW 13.3 12/27/2021 1459    ASSESSMENT AND PLAN: 1. Essential hypertension (Primary) Not at goal.  Continue carvedilol.  I had our clinical pharmacist to call downstairs to our pharmacy to make sure that he gets the amlodipine today.  Will bring him back in several weeks to recheck him.  Advised to bring all medicines with him to every appointment. - carvedilol (COREG) 6.25 MG tablet; Take 1 tablet (6.25 mg total) by mouth 2 (two) times daily with a meal.  Dispense: 180 tablet; Refill: 1 - amLODipine (NORVASC) 5 MG tablet; Take 1 tablet (5 mg total) by mouth daily.  Dispense: 90 tablet; Refill: 3  2. Abnormal LFTs Levels have been trending down.  We will recheck liver function test today  with a GGT. - Hepatic  Function Panel - Gamma GT  3. Positive FIT (fecal immunochemical test) I have tried unsuccessfully to get him in with GI.  Whom this time I printed the information for [facility] on his discharge summary.  I told him to show this information to his son so that he can call the specialist and schedule an appointment for him to be seen.  4. Language barrier   5. Need for shingles vaccine - Zoster Vaccine Adjuvanted [facility]) injection; Inject 0.5 mLs into the muscle once for 1 dose.  Dispense: 0.5 mL; Refill: 0    Patient was given the opportunity to ask questions.  Patient verbalized understanding of the plan and was able to repeat key elements of the plan.   This documentation was completed using Paediatric nurse.  Any transcriptional errors are unintentional.  Orders Placed This Encounter  Procedures   Hepatic Function Panel   Gamma GT     Requested Prescriptions   Signed Prescriptions Disp Refills   carvedilol (COREG) 6.25 MG tablet 180 tablet 1    Sig: Take 1 tablet (6.25 mg total) by mouth 2 (two) times daily with a meal.   amLODipine (NORVASC) 5 MG tablet 90 tablet 3    Sig: Take 1 tablet (5 mg total) by mouth daily.   Zoster Vaccine Adjuvanted [facility]) injection 0.5 mL 0    Sig: Inject 0.5 mLs into the muscle once for 1 dose.    Return in about 6 weeks (around 08/14/2023) for BP recheck.  Jonah Blue, MD, FACP

## 2023-07-03 NOTE — Patient Instructions (Addendum)
 Please have your son call [facility] to schedule an appointment for your colonoscopy.  Their information is:   [facility] [address] [place], [place] [address] PH# [phone]   Please stop at the pharmacy to get your other blood pressure medication call Amlodipine 5 mg.

## 2023-07-04 LAB — HEPATIC FUNCTION PANEL
ALT: 20 IU/L (ref 0–44)
AST: 54 IU/L — ABNORMAL HIGH (ref 0–40)
Albumin: 4.6 g/dL (ref 3.9–4.9)
Alkaline Phosphatase: 106 IU/L (ref 44–121)
Bilirubin Total: 1 mg/dL (ref 0.0–1.2)
Bilirubin, Direct: 0.27 mg/dL (ref 0.00–0.40)
Total Protein: 8.9 g/dL — ABNORMAL HIGH (ref 6.0–8.5)

## 2023-07-04 LAB — GAMMA GT: GGT: 318 IU/L — ABNORMAL HIGH (ref 0–65)

## 2023-07-06 ENCOUNTER — Ambulatory Visit: Payer: Self-pay | Admitting: Gastroenterology

## 2023-07-06 ENCOUNTER — Other Ambulatory Visit: Payer: Self-pay

## 2023-07-14 ENCOUNTER — Telehealth: Payer: Self-pay | Admitting: Internal Medicine

## 2023-07-14 NOTE — Telephone Encounter (Signed)
 Pt given lab results per notes of Dr. Laural Benes on 07/04/23. Pt verbalized understanding.  Marcine Matar, MD 07/04/2023  8:25 AM EST     Liver function tests have significantly improved.   Copied from CRM [id number]. Topic: Clinical - Lab/Test Results >> Jul 14, 2023  2:12 PM Priscille Loveless wrote: Reason for CRM: pt is returning call about lab results

## 2023-07-31 ENCOUNTER — Other Ambulatory Visit: Payer: Self-pay

## 2023-07-31 ENCOUNTER — Other Ambulatory Visit: Payer: Self-pay | Admitting: Internal Medicine

## 2023-07-31 MED ORDER — OMEPRAZOLE 40 MG PO CPDR
40.0000 mg | DELAYED_RELEASE_CAPSULE | Freq: Every day | ORAL | 0 refills | Status: DC
  Filled 2023-07-31: qty 30, 30d supply, fill #0

## 2023-08-03 ENCOUNTER — Other Ambulatory Visit: Payer: Self-pay

## 2023-08-31 ENCOUNTER — Other Ambulatory Visit: Payer: Self-pay | Admitting: Internal Medicine

## 2023-08-31 ENCOUNTER — Other Ambulatory Visit: Payer: Self-pay

## 2023-08-31 MED ORDER — OMEPRAZOLE 40 MG PO CPDR
40.0000 mg | DELAYED_RELEASE_CAPSULE | Freq: Every day | ORAL | 0 refills | Status: DC
  Filled 2023-08-31: qty 30, 30d supply, fill #0

## 2023-09-01 ENCOUNTER — Other Ambulatory Visit: Payer: Self-pay

## 2023-09-03 ENCOUNTER — Ambulatory Visit: Payer: Self-pay | Attending: Internal Medicine | Admitting: Internal Medicine

## 2023-09-03 ENCOUNTER — Other Ambulatory Visit: Payer: Self-pay

## 2023-09-03 VITALS — BP 131/80 | HR 71 | Temp 98.7°F | Wt 180.0 lb

## 2023-09-03 DIAGNOSIS — I10 Essential (primary) hypertension: Secondary | ICD-10-CM

## 2023-09-03 DIAGNOSIS — Z23 Encounter for immunization: Secondary | ICD-10-CM

## 2023-09-03 DIAGNOSIS — R195 Other fecal abnormalities: Secondary | ICD-10-CM

## 2023-09-03 MED ORDER — OMEPRAZOLE 40 MG PO CPDR
40.0000 mg | DELAYED_RELEASE_CAPSULE | Freq: Every day | ORAL | 3 refills | Status: DC
  Filled 2023-09-03 – 2023-10-02 (×2): qty 30, 30d supply, fill #0
  Filled 2023-12-17: qty 30, 30d supply, fill #1
  Filled 2024-01-28 (×2): qty 30, 30d supply, fill #2
  Filled 2024-03-18 (×2): qty 30, 30d supply, fill #3

## 2023-09-03 MED ORDER — ZOSTER VAC RECOMB ADJUVANTED 50 MCG/0.5ML IM SUSR
0.5000 mL | Freq: Once | INTRAMUSCULAR | 0 refills | Status: AC
  Filled 2023-09-03: qty 1, 1d supply, fill #0

## 2023-09-03 MED ORDER — ZOSTER VAC RECOMB ADJUVANTED 50 MCG/0.5ML IM SUSR: 0.5000 mL | Freq: Once | INTRAMUSCULAR | 0 refills | Status: AC

## 2023-09-03 NOTE — Progress Notes (Signed)
 Patient ID: Jason Kent, male    DOB: 11/26/1960  MRN: [id number]  CC: Hypertension (HTN f/u. Med refills. /No questions / concerns/Yes to shingles vax. )   Subjective: Jason Kent is a 63 y.o. male who presents for chronic ds management. His concerns today include:  Patient with history of HTN, IDA (resolved), tobacco dependence, HL,   AMN Language interpreter used during this encounter. #Sushil, [id number]   HTN:  presents for f/u HTN.  On last visit he had Coreg  but was not taking Norvasc  as I thought.  We RF Norvasc  and told him to continue Coreg  6.25 mg BID.  Reports compliance with taking both meds since last visit.  He has his medication bottles with him. Checks BP Q1-2 wks.  Gives SBP range of 120-131.  Does not recall DBP He limits salt in foods No CP/SOB.  HM:  due for 2nd Shingrix  vaccine.  Has hx of FIT pos test.  Referred to GI but we have not been successful in getting him to GI.  He was suppose to talk with his son about this.  Reports he has not gotten chance to talk with him as yet.   Patient Active Problem List   Diagnosis Date Noted   Tachycardia 11/29/2019   Headache syndrome 11/29/2019   Iron deficiency anemia 06/18/2018   Mixed hyperlipidemia 06/18/2018   Essential hypertension 05/10/2018   Tobacco dependence 05/10/2018     Current Outpatient Medications on File Prior to Visit  Medication Sig Dispense Refill   amLODipine  (NORVASC ) 5 MG tablet Take 1 tablet (5 mg total) by mouth daily. 90 tablet 3   carvedilol  (COREG ) 6.25 MG tablet Take 1 tablet (6.25 mg total) by mouth 2 (two) times daily with a meal. 180 tablet 1   [DISCONTINUED] ferrous sulfate  (FERROUSUL) 325 (65 FE) MG tablet Take 1 tablet (325 mg total) by mouth daily with breakfast. 60 tablet 1   No current facility-administered medications on file prior to visit.    No Known Allergies  Social History   Socioeconomic History   Marital status: Married    Spouse name: Not on file    Number of children: Not on file   Years of education: Not on file   Highest education level: Not on file  Occupational History   Not on file  Tobacco Use   Smoking status: Former    Current packs/day: 0.25    Types: Cigarettes   Smokeless tobacco: Current    Types: Chew  Vaping Use   Vaping status: Never Used  Substance and Sexual Activity   Alcohol use: Yes    Alcohol/week: 2.0 standard drinks of alcohol    Types: 2 Cans of beer per week   Drug use: No   Sexual activity: Not on file  Other Topics Concern   Not on file  Social History Narrative   Not on file   Social Drivers of Health   Financial Resource Strain: Not on file  Food Insecurity: Not on file  Transportation Needs: Not on file  Physical Activity: Not on file  Stress: Not on file  Social Connections: Not on file  Intimate Partner Violence: Not on file    Family History  Problem Relation Age of Onset   Hypertension Brother     No past surgical history on file.  ROS: Review of Systems Negative except as stated above  PHYSICAL EXAM: BP 131/80   Pulse 71   Temp 98.7 F (37.1 C) (Oral)  Wt 180 lb (81.6 kg)   SpO2 98%   BMI 29.05 kg/m   Physical Exam   General appearance - alert, well appearing, and in no distress Mental status - normal mood, behavior, speech, dress, motor activity, and thought processes Chest - clear to auscultation, no wheezes, rales or rhonchi, symmetric air entry Heart - normal rate, regular rhythm, normal S1, S2, no murmurs, rubs, clicks or gallops Extremities - peripheral pulses normal, no pedal edema, no clubbing or cyanosis     05/19/2022    9:34 AM 12/27/2021    2:07 PM 02/28/2021    3:32 PM  Depression screen PHQ 2/9  Decreased Interest 0 0 0  Down, Depressed, Hopeless 0 0 0  PHQ - 2 Score 0 0 0       Latest Ref Rng & Units 07/03/2023   11:07 AM 03/03/2023    9:57 AM 11/27/2022   10:45 AM  CMP  Glucose 70 - 99 mg/dL   161   BUN 8 - 27 mg/dL   7    Creatinine 0.96 - 1.27 mg/dL   0.45   Sodium 409 - 811 mmol/L   138   Potassium 3.5 - 5.2 mmol/L   4.6   Chloride 96 - 106 mmol/L   104   CO2 20 - 29 mmol/L   21   Calcium  8.6 - 10.2 mg/dL   8.9   Total Protein 6.0 - 8.5 g/dL 8.9  7.3  6.7   Total Bilirubin 0.0 - 1.2 mg/dL 1.0  0.9  1.3   Alkaline Phos 44 - 121 IU/L 106  171  210   AST 0 - 40 IU/L 54  159  246   ALT 0 - 44 IU/L 20  43  94    Lipid Panel     Component Value Date/Time   CHOL 192 11/27/2022 1045   TRIG 405 (H) 11/27/2022 1045   HDL 17 (L) 11/27/2022 1045   CHOLHDL 11.3 (H) 11/27/2022 1045   LDLCALC 105 (H) 11/27/2022 1045    CBC    Component Value Date/Time   WBC 5.1 09/29/2022 1117   RBC 4.57 09/29/2022 1117   HGB 15.2 09/29/2022 1117   HGB 14.2 12/27/2021 1459   HCT 44.8 09/29/2022 1117   HCT 43.0 12/27/2021 1459   PLT 302 09/29/2022 1117   PLT 240 12/27/2021 1459   MCV 98.0 09/29/2022 1117   MCV 94 12/27/2021 1459   MCH 33.3 09/29/2022 1117   MCHC 33.9 09/29/2022 1117   RDW 12.9 09/29/2022 1117   RDW 13.3 12/27/2021 1459    ASSESSMENT AND PLAN: 1. Essential hypertension (Primary) Close to goal.  Patient to continue carvedilol  6.25 twice a day and Norvasc  5 mg daily.  Continue low-salt diet.  2. Need for shingles vaccine He is agreeable to receiving his second Shingrix  vaccine.  I have printed prescription and given to him to take to the pharmacy. - Zoster Vaccine Adjuvanted (SHINGRIX ) injection; Inject 0.5 mLs into the muscle once for 1 dose.  Dispense: 0.5 mL; Refill: 0  3. Positive FIT (fecal immunochemical test) Continue to discussed the importance of need for colonoscopy given this initial screening test.  Patient states he will find time to talk with his son about it.   Patient was given the opportunity to ask questions.  Patient verbalized understanding of the plan and was able to repeat key elements of the plan.   This documentation was completed using Public librarian.  Any transcriptional errors are unintentional.  No orders of the defined types were placed in this encounter.    Requested Prescriptions   Signed Prescriptions Disp Refills   omeprazole  (PRILOSEC) 40 MG capsule 30 capsule 3    Sig: Take 1 capsule (40 mg total) by mouth daily.   Zoster Vaccine Adjuvanted (SHINGRIX ) injection 0.5 mL 0    Sig: Inject 0.5 mLs into the muscle once for 1 dose.    Return in about 4 months (around 01/04/2024).  Concetta Dee, MD, FACP

## 2023-10-02 ENCOUNTER — Other Ambulatory Visit: Payer: Self-pay

## 2023-12-17 ENCOUNTER — Other Ambulatory Visit: Payer: Self-pay

## 2023-12-18 ENCOUNTER — Other Ambulatory Visit: Payer: Self-pay

## 2024-01-06 ENCOUNTER — Telehealth: Payer: Self-pay | Admitting: Internal Medicine

## 2024-01-06 NOTE — Telephone Encounter (Signed)
 Volunteer attempted to call patient to confirm appointment for 01/08/2024; phone number invalid.

## 2024-01-07 ENCOUNTER — Encounter: Payer: Self-pay | Admitting: Internal Medicine

## 2024-01-07 ENCOUNTER — Other Ambulatory Visit: Payer: Self-pay

## 2024-01-07 ENCOUNTER — Ambulatory Visit: Payer: Self-pay | Attending: Internal Medicine | Admitting: Internal Medicine

## 2024-01-07 VITALS — BP 120/79 | HR 70 | Temp 98.5°F | Ht 66.0 in | Wt 184.0 lb

## 2024-01-07 DIAGNOSIS — K219 Gastro-esophageal reflux disease without esophagitis: Secondary | ICD-10-CM

## 2024-01-07 DIAGNOSIS — E782 Mixed hyperlipidemia: Secondary | ICD-10-CM

## 2024-01-07 DIAGNOSIS — Z125 Encounter for screening for malignant neoplasm of prostate: Secondary | ICD-10-CM

## 2024-01-07 DIAGNOSIS — Z23 Encounter for immunization: Secondary | ICD-10-CM

## 2024-01-07 DIAGNOSIS — I10 Essential (primary) hypertension: Secondary | ICD-10-CM

## 2024-01-07 MED ORDER — PREVNAR 20 0.5 ML IM SUSY: 0.5000 mL | PREFILLED_SYRINGE | INTRAMUSCULAR | 0 refills | Status: AC

## 2024-01-07 MED ORDER — CARVEDILOL 6.25 MG PO TABS
6.2500 mg | ORAL_TABLET | Freq: Two times a day (BID) | ORAL | 1 refills | Status: DC
  Filled 2024-01-07 – 2024-03-18 (×3): qty 180, 90d supply, fill #0

## 2024-01-07 NOTE — Patient Instructions (Signed)
 VISIT SUMMARY:  You came in today for a follow-up visit to manage your hypertension and high cholesterol. We discussed your current medications, blood pressure readings, and overall health maintenance. You received a flu vaccine today and were given a prescription for the pneumonia vaccine. We also talked about the importance of scheduling a colonoscopy.  YOUR PLAN:  -ESSENTIAL HYPERTENSION: Your blood pressure is well-controlled with your current medications, carvedilol  and amlodipine . Hypertension means high blood pressure, which can lead to serious health problems if not managed. Continue taking your medications as prescribed, limit salt intake, eat more fresh vegetables, and keep monitoring your blood pressure at home.  -GASTROESOPHAGEAL REFLUX DISEASE: Your acid reflux symptoms are well-managed with omeprazole . This condition occurs when stomach acid frequently flows back into the tube connecting your mouth and stomach. Continue taking omeprazole  as prescribed.  -MIXED HYPERLIPIDEMIA: This condition means you have high levels of different types of fats in your blood. Your previous medication was stopped due to liver enzyme issues. We will re-evaluate your cholesterol levels with a blood test. In the meantime, cook with healthy oils like olive oil and avoid fatty foods.  -GENERAL HEALTH MAINTENANCE: You are due for some routine health maintenance. You received a flu vaccine today and were given a prescription for the pneumonia vaccine. It is important to schedule a colonoscopy, especially since you had a positive stool test last year.  INSTRUCTIONS:  Please schedule a follow-up appointment in four months. Also, make sure to schedule a colonoscopy with a gastroenterologist as soon as possible.

## 2024-01-07 NOTE — Progress Notes (Signed)
 Patient ID: Jason Kent, male    DOB: Jun 13, 1960  MRN: [id number]  CC: Hypertension (HTN f/u. Med refill. /No questions / concerns/Yes to flu vax)   Subjective: Jason Kent is a 63 y.o. male who presents for chronic ds management. His concerns today include:  Patient with history of HTN, IDA (resolved), tobacco dependence, HL,   [employer] Language interpreter used during this encounter. #[id number], [place]   Discussed the use of AI scribe software for clinical note transcription with the patient, who gave verbal consent to proceed.  History of Present Illness Jason Kent is a 63 year old male with hypertension and high cholesterol who presents for a follow-up visit.  HTN: He is currently taking carvedilol  6.25 mg twice daily and amlodipine  5 mg once daily in the morning. Has bottles with him. Blood pressure readings at home are mostly around 120/80 mmHg, occasionally rising to 131/80 mmHg, at which point he experiences symptoms and checks his blood pressure more frequently. He checks his blood pressure every two weeks or more frequently if symptomatic. No chest pain or shortness of breath.  He is on omeprazole  for acid reflux, which effectively manages his symptoms. No current stomach pain.  He has a history of high cholesterol and was previously on statin medication, which was discontinued last yr due to abnormal liver enzyme levels. He avoids fatty foods and does not consume meat, stating 'I don't eat anything that has fat.'  He does not engage in formal exercise due to his work schedule, which involves physical activity such as packing and walking. He does not have time for additional exercise outside of work.    HM: agrees to flu shot. Due for PCV vaccine. Still has not f/u with GI for c-scope given hx of +FIT test. Reports he does not have time to do c-scope due to work schedule; reports no changes in BM. He declines having it done.  Patient Active Problem List   Diagnosis  Date Noted   Tachycardia 11/29/2019   Headache syndrome 11/29/2019   Iron deficiency anemia 06/18/2018   Mixed hyperlipidemia 06/18/2018   Essential hypertension 05/10/2018   Tobacco dependence 05/10/2018     Current Outpatient Medications on File Prior to Visit  Medication Sig Dispense Refill   amLODipine  (NORVASC ) 5 MG tablet Take 1 tablet (5 mg total) by mouth daily. 90 tablet 3   carvedilol  (COREG ) 6.25 MG tablet Take 1 tablet (6.25 mg total) by mouth 2 (two) times daily with a meal. 180 tablet 1   omeprazole  (PRILOSEC) 40 MG capsule Take 1 capsule (40 mg total) by mouth daily. 30 capsule 3   [DISCONTINUED] ferrous sulfate  (FERROUSUL) 325 (65 FE) MG tablet Take 1 tablet (325 mg total) by mouth daily with breakfast. 60 tablet 1   No current facility-administered medications on file prior to visit.    No Known Allergies  Social History   Socioeconomic History   Marital status: Married    Spouse name: Not on file   Number of children: Not on file   Years of education: Not on file   Highest education level: Not on file  Occupational History   Not on file  Tobacco Use   Smoking status: Former    Current packs/day: 0.25    Types: Cigarettes   Smokeless tobacco: Current    Types: Chew  Vaping Use   Vaping status: Never Used  Substance and Sexual Activity   Alcohol use: Yes    Alcohol/week: 2.0 standard  drinks of alcohol    Types: 2 Cans of beer per week   Drug use: No   Sexual activity: Not on file  Other Topics Concern   Not on file  Social History Narrative   Not on file   Social Drivers of Health   Financial Resource Strain: Not on file  Food Insecurity: Not on file  Transportation Needs: Not on file  Physical Activity: Not on file  Stress: Not on file  Social Connections: Not on file  Intimate Partner Violence: Not on file    Family History  Problem Relation Age of Onset   Hypertension Brother     No past surgical history on file.  ROS: Review of  Systems Negative except as stated above  PHYSICAL EXAM: BP 120/79 (BP Location: Left Arm, Patient Position: Sitting, Cuff Size: Normal)   Pulse 70   Temp 98.5 F (36.9 C) (Oral)   Ht 5' 6 (1.676 m)   Wt 184 lb (83.5 kg)   SpO2 99%   BMI 29.70 kg/m   Wt Readings from Last 3 Encounters:  01/07/24 184 lb (83.5 kg)  09/03/23 180 lb (81.6 kg)  07/03/23 176 lb 9.6 oz (80.1 kg)    Physical Exam General appearance - alert, well appearing, older male and in no distress Mental status - normal mood, behavior, speech, dress, motor activity, and thought processes Neck - supple, no significant adenopathy. No thyromegaly Chest - clear to auscultation, no wheezes, rales or rhonchi, symmetric air entry Heart - normal rate, regular rhythm, normal S1, S2, no murmurs, rubs, clicks or gallops Extremities - peripheral pulses normal, no pedal edema, no clubbing or cyanosis     Latest Ref Rng & Units 07/03/2023   11:07 AM 03/03/2023    9:57 AM 11/27/2022   10:45 AM  CMP  Glucose 70 - 99 mg/dL   896   BUN 8 - 27 mg/dL   7   Creatinine 9.23 - 1.27 mg/dL   9.29   Sodium 865 - 855 mmol/L   138   Potassium 3.5 - 5.2 mmol/L   4.6   Chloride 96 - 106 mmol/L   104   CO2 20 - 29 mmol/L   21   Calcium  8.6 - 10.2 mg/dL   8.9   Total Protein 6.0 - 8.5 g/dL 8.9  7.3  6.7   Total Bilirubin 0.0 - 1.2 mg/dL 1.0  0.9  1.3   Alkaline Phos 44 - 121 IU/L 106  171  210   AST 0 - 40 IU/L 54  159  246   ALT 0 - 44 IU/L 20  43  94    Lipid Panel     Component Value Date/Time   CHOL 192 11/27/2022 1045   TRIG 405 (H) 11/27/2022 1045   HDL 17 (L) 11/27/2022 1045   CHOLHDL 11.3 (H) 11/27/2022 1045   LDLCALC 105 (H) 11/27/2022 1045    CBC    Component Value Date/Time   WBC 5.1 09/29/2022 1117   RBC 4.57 09/29/2022 1117   HGB 15.2 09/29/2022 1117   HGB 14.2 12/27/2021 1459   HCT 44.8 09/29/2022 1117   HCT 43.0 12/27/2021 1459   PLT 302 09/29/2022 1117   PLT 240 12/27/2021 1459   MCV 98.0 09/29/2022  1117   MCV 94 12/27/2021 1459   MCH 33.3 09/29/2022 1117   MCHC 33.9 09/29/2022 1117   RDW 12.9 09/29/2022 1117   RDW 13.3 12/27/2021 1459    ASSESSMENT AND  PLAN: 1. Essential hypertension (Primary) At goal.  Patient to continue carvedilol  6.25 mg twice a day and amlodipine  5 mg daily - carvedilol  (COREG ) 6.25 MG tablet; Take 1 tablet (6.25 mg total) by mouth 2 (two) times daily with a meal.  Dispense: 180 tablet; Refill: 1 - CBC - Comprehensive metabolic panel with GFR  2. Mixed hyperlipidemia Encouraged him to continue trying to eat healthy and to cook with healthy oils like olive oil - Lipid panel  3. Gastroesophageal reflux disease without esophagitis Controlled on omeprazole   4. Prostate cancer screening Patient agreeable to screening - PSA  5. Need for influenza vaccination Flu vaccine given today.  6. Need for vaccination against Streptococcus pneumoniae Printed prescription for Prevnar 20  vaccine and gave to patient to take to any outside pharmacy or the health department. - pneumococcal 20-valent conjugate vaccine (PREVNAR 20 ) 0.5 ML injection; Inject 0.5 mLs into the muscle tomorrow at 10 am for 1 dose.  Dispense: 0.5 mL; Refill: 0    Patient was given the opportunity to ask questions.  Patient verbalized understanding of the plan and was able to repeat key elements of the plan.   This documentation was completed using Paediatric nurse.  Any transcriptional errors are unintentional.  No orders of the defined types were placed in this encounter.    Requested Prescriptions   Pending Prescriptions Disp Refills   carvedilol  (COREG ) 6.25 MG tablet 180 tablet 1    Sig: Take 1 tablet (6.25 mg total) by mouth 2 (two) times daily with a meal.    No follow-ups on file.  Barnie Louder, MD, FACP

## 2024-01-08 LAB — LIPID PANEL
Chol/HDL Ratio: 4.6 ratio (ref 0.0–5.0)
Cholesterol, Total: 274 mg/dL — ABNORMAL HIGH (ref 100–199)
HDL: 59 mg/dL (ref >39)
LDL Chol Calc (NIH): 194 mg/dL — ABNORMAL HIGH (ref 0–99)
Triglycerides: 117 mg/dL (ref 0–149)
VLDL Cholesterol Cal: 21 mg/dL (ref 5–40)

## 2024-01-08 LAB — COMPREHENSIVE METABOLIC PANEL WITH GFR
ALT: 49 IU/L — ABNORMAL HIGH (ref 0–44)
AST: 96 IU/L — ABNORMAL HIGH (ref 0–40)
Albumin: 4.8 g/dL (ref 3.9–4.9)
Alkaline Phosphatase: 94 IU/L (ref 44–121)
BUN/Creatinine Ratio: 10 (ref 10–24)
BUN: 9 mg/dL (ref 8–27)
Bilirubin Total: 0.7 mg/dL (ref 0.0–1.2)
CO2: 18 mmol/L — ABNORMAL LOW (ref 20–29)
Calcium: 10.3 mg/dL — ABNORMAL HIGH (ref 8.6–10.2)
Chloride: 98 mmol/L (ref 96–106)
Creatinine, Ser: 0.89 mg/dL (ref 0.76–1.27)
Globulin, Total: 3.9 g/dL (ref 1.5–4.5)
Glucose: 120 mg/dL — ABNORMAL HIGH (ref 70–99)
Potassium: 4.8 mmol/L (ref 3.5–5.2)
Sodium: 137 mmol/L (ref 134–144)
Total Protein: 8.7 g/dL — ABNORMAL HIGH (ref 6.0–8.5)
eGFR: 97 mL/min/1.73 (ref >59)

## 2024-01-08 LAB — PSA: Prostate Specific Ag, Serum: 1.1 ng/mL (ref 0.0–4.0)

## 2024-01-08 LAB — CBC
Hematocrit: 43 % (ref 37.5–51.0)
Hemoglobin: 13.2 g/dL (ref 13.0–17.7)
MCH: 28.1 pg (ref 26.6–33.0)
MCHC: 30.7 g/dL — ABNORMAL LOW (ref 31.5–35.7)
MCV: 92 fL (ref 79–97)
Platelets: 280 x10E3/uL (ref 150–450)
RBC: 4.69 x10E6/uL (ref 4.14–5.80)
RDW: 14.3 % (ref 11.6–15.4)
WBC: 5.6 x10E3/uL (ref 3.4–10.8)

## 2024-01-10 ENCOUNTER — Ambulatory Visit: Payer: Self-pay | Admitting: Internal Medicine

## 2024-01-10 DIAGNOSIS — R7989 Other specified abnormal findings of blood chemistry: Secondary | ICD-10-CM

## 2024-01-22 ENCOUNTER — Ambulatory Visit: Payer: Self-pay | Attending: Internal Medicine

## 2024-01-28 ENCOUNTER — Other Ambulatory Visit: Payer: Self-pay

## 2024-01-29 ENCOUNTER — Other Ambulatory Visit: Payer: Self-pay

## 2024-02-11 ENCOUNTER — Ambulatory Visit: Payer: Self-pay | Attending: Internal Medicine

## 2024-03-18 ENCOUNTER — Other Ambulatory Visit: Payer: Self-pay

## 2024-03-21 ENCOUNTER — Other Ambulatory Visit: Payer: Self-pay

## 2024-05-09 ENCOUNTER — Encounter: Payer: Self-pay | Admitting: Internal Medicine

## 2024-05-09 ENCOUNTER — Other Ambulatory Visit: Payer: Self-pay | Admitting: Internal Medicine

## 2024-05-09 ENCOUNTER — Other Ambulatory Visit: Payer: Self-pay

## 2024-05-09 ENCOUNTER — Ambulatory Visit: Payer: Self-pay | Attending: Internal Medicine | Admitting: Internal Medicine

## 2024-05-09 VITALS — BP 133/86 | HR 79 | Temp 98.4°F | Ht 66.0 in | Wt 193.0 lb

## 2024-05-09 DIAGNOSIS — I10 Essential (primary) hypertension: Secondary | ICD-10-CM

## 2024-05-09 DIAGNOSIS — K219 Gastro-esophageal reflux disease without esophagitis: Secondary | ICD-10-CM

## 2024-05-09 DIAGNOSIS — L729 Follicular cyst of the skin and subcutaneous tissue, unspecified: Secondary | ICD-10-CM

## 2024-05-09 DIAGNOSIS — F1090 Alcohol use, unspecified, uncomplicated: Secondary | ICD-10-CM

## 2024-05-09 DIAGNOSIS — E782 Mixed hyperlipidemia: Secondary | ICD-10-CM

## 2024-05-09 DIAGNOSIS — Z1211 Encounter for screening for malignant neoplasm of colon: Secondary | ICD-10-CM

## 2024-05-09 DIAGNOSIS — R7989 Other specified abnormal findings of blood chemistry: Secondary | ICD-10-CM

## 2024-05-09 MED ORDER — CARVEDILOL 6.25 MG PO TABS
6.2500 mg | ORAL_TABLET | Freq: Two times a day (BID) | ORAL | 1 refills | Status: AC
  Filled 2024-05-09: qty 180, 90d supply, fill #0

## 2024-05-09 MED ORDER — OMEPRAZOLE 40 MG PO CPDR
40.0000 mg | DELAYED_RELEASE_CAPSULE | Freq: Every day | ORAL | 3 refills | Status: AC
  Filled 2024-05-09: qty 30, 30d supply, fill #0

## 2024-05-09 MED ORDER — AMLODIPINE BESYLATE 10 MG PO TABS
10.0000 mg | ORAL_TABLET | Freq: Every day | ORAL | 1 refills | Status: AC
  Filled 2024-05-09: qty 90, 90d supply, fill #0

## 2024-05-09 NOTE — Patient Instructions (Signed)
" °  VISIT SUMMARY: Today, we reviewed your chronic medical conditions, including hypertension, hyperlipidemia, liver function, acid reflux, and subcutaneous cysts. We also discussed general health maintenance, including vaccinations and cancer screening.  YOUR PLAN: -ESSENTIAL HYPERTENSION: Hypertension means high blood pressure. Your blood pressure goal is 130/80 mmHg or lower. Your current blood pressure is 145/90 mmHg, so we increased your amlodipine  dose to 10 mg daily. Continue taking carvedilol  as prescribed and try to eat more vegetables.  -MIXED HYPERLIPIDEMIA: Hyperlipidemia means high levels of fats in your blood, including cholesterol. Your LDL cholesterol is 194 mg/dL, and the goal is less than 100 mg/dL. We recommend dietary changes, such as using healthy oils like olive oil.  -ABNORMAL LIVER FUNCTION TESTS WITH ALCOHOL USE: Your liver function tests are elevated, possibly due to alcohol consumption. You plan to quit drinking, and we have ordered new liver function tests to check your current status. Please stop drinking alcohol.  -GASTROESOPHAGEAL REFLUX DISEASE: This condition causes stomach acid to flow back into your esophagus, leading to heartburn. You are managing it well with Prilosec. Continue taking Prilosec as prescribed.  -SUBCUTANEOUS CYSTS: These are non-painful lumps under your skin filled with fluid. They have not changed in size. We will continue to monitor them for any changes.  -GENERAL HEALTH MAINTENANCE: You need a pneumonia vaccine, which you can get through the health department. For colon cancer screening, you had a positive stool test in January 2024. We provided you with another stool test kit and discussed the possibility of a colonoscopy if the test is positive.  INSTRUCTIONS: Please follow up for liver function tests as ordered. Obtain the pneumonia vaccine through the health department. Complete the stool test kit for colon cancer screening and consider a  colonoscopy if the test is positive.                      Contains text generated by [employer]bridge.                                 Contains text generated by [employer]bridge.   "

## 2024-05-09 NOTE — Progress Notes (Signed)
 "   Patient ID: Jason Kent, male    DOB: 25-Aug-1960  MRN: [id number]  CC: Hypertension (HTN f/u. Med refills./No questions / concerns/Already received flu vax.)   Subjective: Jason Kent is a 64 y.o. male who presents for chronic ds management. His concerns today include:  Patient with history of HTN, IDA (resolved), tobacco dependence, HL, ETOH use, Hx of +FIT/declined referral c-scope due to work schedule  AMN Language interpreter used during this encounter: [place] [id number]   Discussed the use of AI scribe software for clinical note transcription with the patient, who gave verbal consent to proceed.  History of Present Illness Jason Kent is a 64 year old male who presents for follow-up of his chronic medical conditions.  HTN: He is managing hypertension with carvedilol  6.25 mg BID and amlodipine  5 mg daily, taking both medications as prescribed and has them with him today. Home blood pressure readings occasionally reach 140 mmHg. He is actively limiting salt intake. No chest pain or shortness of breath at rest/exertion.  Regarding hyperlipidemia, his LDL cholesterol was 194 mg/dL four months ago. He was previously on cholesterol medication, which was discontinued in spring 2024 due to elevated liver function tests. LFTs almost normalized on subsequent check but found to be  elevated again four months ago.  He had told my CMA when she called him with his lab results that he was drinking 24 ounce beer 4-5 times a week.  Since then, patient reports that he is cut back to 1 small can of beer twice a week on the weekends.  Past hx of ETOH UD.   He is taking Prilosec for acid reflux and reports no current stomach problems. Request RF  HM: He has not yet received the pneumonia vaccine. He received a flu vaccine during his last visit. For colon cancer screening, he had a positive stool test in January 2024 but declined a colonoscopy due to work commitments. Request note verifying  appt with me today to take to his job to avoid getting points.    Patient Active Problem List   Diagnosis Date Noted   Tachycardia 11/29/2019   Headache syndrome 11/29/2019   Iron deficiency anemia 06/18/2018   Mixed hyperlipidemia 06/18/2018   Essential hypertension 05/10/2018   Tobacco dependence 05/10/2018     Medications Ordered Prior to Encounter[1]  Allergies[2]  Social History   Socioeconomic History   Marital status: Married    Spouse name: Not on file   Number of children: Not on file   Years of education: Not on file   Highest education level: Not on file  Occupational History   Not on file  Tobacco Use   Smoking status: Former    Current packs/day: 0.25    Types: Cigarettes   Smokeless tobacco: Current    Types: Chew  Vaping Use   Vaping status: Never Used  Substance and Sexual Activity   Alcohol use: Yes    Alcohol/week: 2.0 standard drinks of alcohol    Types: 2 Cans of beer per week   Drug use: No   Sexual activity: Not on file  Other Topics Concern   Not on file  Social History Narrative   Not on file   Social Drivers of Health   Tobacco Use: High Risk (05/09/2024)   Patient History    Smoking Tobacco Use: Former    Smokeless Tobacco Use: Current    Passive Exposure: Not on Actuary Strain: Not on file  Food  Insecurity: Not on file  Transportation Needs: Not on file  Physical Activity: Not on file  Stress: Not on file  Social Connections: Not on file  Intimate Partner Violence: Not on file  Depression (PHQ2-9): Low Risk (05/09/2024)   Depression (PHQ2-9)    PHQ-2 Score: 0  Alcohol Screen: Not on file  Housing: Not on file  Utilities: Not on file  Health Literacy: Not on file    Family History  Problem Relation Age of Onset   Hypertension Brother     History reviewed. No pertinent surgical history.  ROS: Review of Systems Negative except as stated above  PHYSICAL EXAM: BP 133/86   Pulse 79   Temp 98.4 F  (36.9 C) (Oral)   Ht 5' 6 (1.676 m)   Wt 193 lb (87.5 kg)   SpO2 97%   BMI 31.15 kg/m   Wt Readings from Last 3 Encounters:  05/09/24 193 lb (87.5 kg)  01/07/24 184 lb (83.5 kg)  09/03/23 180 lb (81.6 kg)    Physical Exam   General appearance - alert, well appearing, older male and in no distress Mental status - normal mood, behavior, speech, dress, motor activity, and thought processes Eyes: muddy sclera Neck - supple, no significant adenopathy Chest - clear to auscultation, no wheezes, rales or rhonchi, symmetric air entry Heart - normal rate, regular rhythm, normal S1, S2, no murmurs, rubs, clicks or gallops Abdomen - soft, nontender, nondistended, or organomegaly. Has 3 subcut movable masses that he reports have been present for 40 yrs and have not changed and no pain:  LUQ 4x3 cm, RUQ 3x2 cm and RMQ 4x2 cm Extremities - peripheral pulses normal, no pedal edema, no clubbing or cyanosis     05/09/2024   12:07 PM 05/19/2022    9:34 AM 12/27/2021    2:07 PM  Depression screen PHQ 2/9  Decreased Interest 0 0 0  Down, Depressed, Hopeless 0 0 0  PHQ - 2 Score 0 0 0  Altered sleeping 0    Tired, decreased energy 0    Change in appetite 0    Feeling bad or failure about yourself  0    Trouble concentrating 0    Moving slowly or fidgety/restless 0    Suicidal thoughts 0    PHQ-9 Score 0    Difficult doing work/chores Not difficult at all         Latest Ref Rng & Units 01/07/2024   11:16 AM 07/03/2023   11:07 AM 03/03/2023    9:57 AM  CMP  Glucose 70 - 99 mg/dL 879     BUN 8 - 27 mg/dL 9     Creatinine 9.23 - 1.27 mg/dL 9.10     Sodium 865 - 855 mmol/L 137     Potassium 3.5 - 5.2 mmol/L 4.8     Chloride 96 - 106 mmol/L 98     CO2 20 - 29 mmol/L 18     Calcium  8.6 - 10.2 mg/dL 89.6     Total Protein 6.0 - 8.5 g/dL 8.7  8.9  7.3   Total Bilirubin 0.0 - 1.2 mg/dL 0.7  1.0  0.9   Alkaline Phos 44 - 121 IU/L 94  106  171   AST 0 - 40 IU/L 96  54  159   ALT 0 - 44 IU/L 49   20  43    Lipid Panel     Component Value Date/Time   CHOL 274 (H) 01/07/2024 1116  TRIG 117 01/07/2024 1116   HDL 59 01/07/2024 1116   CHOLHDL 4.6 01/07/2024 1116   LDLCALC 194 (H) 01/07/2024 1116    CBC    Component Value Date/Time   WBC 5.6 01/07/2024 1116   WBC 5.1 09/29/2022 1117   RBC 4.69 01/07/2024 1116   RBC 4.57 09/29/2022 1117   HGB 13.2 01/07/2024 1116   HCT 43.0 01/07/2024 1116   PLT 280 01/07/2024 1116   MCV 92 01/07/2024 1116   MCH 28.1 01/07/2024 1116   MCH 33.3 09/29/2022 1117   MCHC 30.7 (L) 01/07/2024 1116   MCHC 33.9 09/29/2022 1117   RDW 14.3 01/07/2024 1116    ASSESSMENT AND PLAN: 1. Essential hypertension (Primary) Improved but not at goal Increase Norvasc  to 10 mg daily and continue Coreg  - amLODipine  (NORVASC ) 10 MG tablet; Take 1 tablet (10 mg total) by mouth daily.  Dispense: 90 tablet; Refill: 1 - carvedilol  (COREG ) 6.25 MG tablet; Take 1 tablet (6.25 mg total) by mouth 2 (two) times daily with a meal.  Dispense: 180 tablet; Refill: 1  2. Mixed hyperlipidemia Discussed and encouraged healthy eating habits.  Advised to cook with healthy oils like olive oil.  He does not eat meat.  Encourage more consumption of fresh vegetables. - Lipid panel  3. Abnormal LFTs Patient has cut back on beer consumption since last visit with me.  Will recheck liver function test today. Encouraged him to try to quit completel - Hepatic Function Panel  4. Gastroesophageal reflux disease without esophagitis Stable on omeprazole  - omeprazole  (PRILOSEC) 40 MG capsule; Take 1 capsule (40 mg total) by mouth daily.  Dispense: 30 capsule; Refill: 3  5. Alcohol use Encouraged him to try to quit completely  6. Subcutaneous cyst Noted to have 3 subcutaneous cyst of the abdomen today.  Patient states these have been present for 40 years and has had the evaluated in the past in [place] and also here in the [place] .  States he was told they are filled with fluid and no need for  concern.  Advised patient that if these increase in size or become painful, he should let me know so that we can refer him to a surgeon to have them removed.  7. Screening for colon cancer Patient with history of positive fit test in 2024.  He was referred to gastroenterology but eventually declined due to not being able to get off work.  Denies any GI symptoms at this time.  He is agreeable to doing FIT tests again for screening and if positive he will reconsider GI referral. - Fecal occult blood, imunochemical([facility])   Patient was given the opportunity to ask questions.  Patient verbalized understanding of the plan and was able to repeat key elements of the plan.   This documentation was completed using Paediatric nurse.  Any transcriptional errors are unintentional.  Orders Placed This Encounter  Procedures   Fecal occult blood, imunochemical([facility])   Hepatic Function Panel   Lipid panel     Requested Prescriptions   Signed Prescriptions Disp Refills   amLODipine  (NORVASC ) 10 MG tablet 90 tablet 1    Sig: Take 1 tablet (10 mg total) by mouth daily.   omeprazole  (PRILOSEC) 40 MG capsule 30 capsule 3    Sig: Take 1 capsule (40 mg total) by mouth daily.   carvedilol  (COREG ) 6.25 MG tablet 180 tablet 1    Sig: Take 1 tablet (6.25 mg total) by mouth 2 (two) times daily with a meal.  Return in about 4 months (around 09/06/2024).  Barnie Louder, MD, FACP     [1]  Current Outpatient Medications on File Prior to Visit  Medication Sig Dispense Refill   [DISCONTINUED] ferrous sulfate  (FERROUSUL) 325 (65 FE) MG tablet Take 1 tablet (325 mg total) by mouth daily with breakfast. 60 tablet 1   No current facility-administered medications on file prior to visit.  [2] No Known Allergies  "

## 2024-05-10 ENCOUNTER — Ambulatory Visit: Payer: Self-pay | Admitting: Internal Medicine

## 2024-05-10 LAB — HEPATIC FUNCTION PANEL
ALT: 45 IU/L — ABNORMAL HIGH (ref 0–44)
AST: 79 IU/L — ABNORMAL HIGH (ref 0–40)
Albumin: 4.7 g/dL (ref 3.9–4.9)
Alkaline Phosphatase: 99 IU/L (ref 47–123)
Bilirubin Total: 0.9 mg/dL (ref 0.0–1.2)
Bilirubin, Direct: 0.25 mg/dL (ref 0.00–0.40)
Total Protein: 8.3 g/dL (ref 6.0–8.5)

## 2024-05-10 LAB — LIPID PANEL
Chol/HDL Ratio: 4.2 ratio (ref 0.0–5.0)
Cholesterol, Total: 258 mg/dL — ABNORMAL HIGH (ref 100–199)
HDL: 62 mg/dL
LDL Chol Calc (NIH): 179 mg/dL — ABNORMAL HIGH (ref 0–99)
Triglycerides: 98 mg/dL (ref 0–149)
VLDL Cholesterol Cal: 17 mg/dL (ref 5–40)

## 2024-05-17 ENCOUNTER — Ambulatory Visit: Payer: Self-pay | Admitting: Internal Medicine

## 2024-05-17 ENCOUNTER — Ambulatory Visit (HOSPITAL_BASED_OUTPATIENT_CLINIC_OR_DEPARTMENT_OTHER)
Admission: RE | Admit: 2024-05-17 | Discharge: 2024-05-17 | Disposition: A | Discharge status: Home / Self Care | Payer: Self-pay | Source: Ambulatory Visit | Attending: Internal Medicine | Admitting: Internal Medicine

## 2024-05-17 DIAGNOSIS — R7989 Other specified abnormal findings of blood chemistry: Secondary | ICD-10-CM | POA: Insufficient documentation

## 2024-05-18 ENCOUNTER — Other Ambulatory Visit: Payer: Self-pay

## 2024-05-19 LAB — FECAL OCCULT BLOOD, IMMUNOCHEMICAL: Fecal Occult Bld: NEGATIVE

## 2024-09-09 ENCOUNTER — Ambulatory Visit: Payer: Self-pay | Admitting: Internal Medicine
